# Patient Record
Sex: Female | Born: 1960 | Race: Black or African American | Hispanic: No | Smoking: Never smoker
Health system: Southern US, Community
[De-identification: ages and names within clinical notes are randomized; demographics above are authoritative.]

## PROBLEM LIST (undated history)

## (undated) HISTORY — PX: TUBAL LIGATION: SHX77

---

## 2002-12-22 ENCOUNTER — Encounter: Admission: RE | Admit: 2002-12-22 | Discharge: 2002-12-22 | Payer: Self-pay | Admitting: Sports Medicine

## 2002-12-22 ENCOUNTER — Encounter: Payer: Self-pay | Admitting: Sports Medicine

## 2008-08-09 ENCOUNTER — Other Ambulatory Visit: Admission: RE | Admit: 2008-08-09 | Discharge: 2008-08-09 | Payer: Self-pay | Admitting: Family Medicine

## 2010-11-05 ENCOUNTER — Encounter: Payer: Self-pay | Admitting: Family Medicine

## 2010-11-20 ENCOUNTER — Encounter: Payer: Self-pay | Admitting: Advanced Practice Midwife

## 2010-11-20 ENCOUNTER — Other Ambulatory Visit: Payer: Self-pay | Admitting: Advanced Practice Midwife

## 2010-11-20 ENCOUNTER — Encounter (INDEPENDENT_AMBULATORY_CARE_PROVIDER_SITE_OTHER): Payer: Self-pay | Admitting: Advanced Practice Midwife

## 2010-11-20 ENCOUNTER — Encounter (INDEPENDENT_AMBULATORY_CARE_PROVIDER_SITE_OTHER): Payer: Self-pay | Admitting: *Deleted

## 2010-11-20 DIAGNOSIS — R220 Localized swelling, mass and lump, head: Secondary | ICD-10-CM

## 2010-11-20 DIAGNOSIS — Z202 Contact with and (suspected) exposure to infections with a predominantly sexual mode of transmission: Secondary | ICD-10-CM

## 2010-11-20 DIAGNOSIS — Z01419 Encounter for gynecological examination (general) (routine) without abnormal findings: Secondary | ICD-10-CM

## 2010-11-20 DIAGNOSIS — R221 Localized swelling, mass and lump, neck: Secondary | ICD-10-CM

## 2010-11-25 ENCOUNTER — Inpatient Hospital Stay (HOSPITAL_COMMUNITY)
Admission: AD | Admit: 2010-11-25 | Discharge: 2010-11-25 | Disposition: A | Discharge status: Home / Self Care | Payer: Self-pay | Source: Ambulatory Visit | Attending: Obstetrics & Gynecology | Admitting: Obstetrics & Gynecology

## 2010-11-25 ENCOUNTER — Emergency Department (HOSPITAL_COMMUNITY): Payer: Self-pay

## 2010-11-25 ENCOUNTER — Emergency Department (HOSPITAL_COMMUNITY)
Admission: EM | Admit: 2010-11-25 | Discharge: 2010-11-25 | Disposition: A | Discharge status: Home / Self Care | Payer: Self-pay | Attending: Emergency Medicine | Admitting: Emergency Medicine

## 2010-11-25 DIAGNOSIS — R221 Localized swelling, mass and lump, neck: Secondary | ICD-10-CM | POA: Insufficient documentation

## 2010-11-25 DIAGNOSIS — R220 Localized swelling, mass and lump, head: Secondary | ICD-10-CM | POA: Insufficient documentation

## 2010-11-25 DIAGNOSIS — E049 Nontoxic goiter, unspecified: Secondary | ICD-10-CM

## 2010-11-25 DIAGNOSIS — R5381 Other malaise: Secondary | ICD-10-CM | POA: Insufficient documentation

## 2010-11-25 DIAGNOSIS — R1310 Dysphagia, unspecified: Secondary | ICD-10-CM | POA: Insufficient documentation

## 2010-11-25 DIAGNOSIS — M542 Cervicalgia: Secondary | ICD-10-CM | POA: Insufficient documentation

## 2010-11-25 DIAGNOSIS — R0989 Other specified symptoms and signs involving the circulatory and respiratory systems: Secondary | ICD-10-CM | POA: Insufficient documentation

## 2010-11-25 DIAGNOSIS — R5383 Other fatigue: Secondary | ICD-10-CM | POA: Insufficient documentation

## 2010-11-25 DIAGNOSIS — R0609 Other forms of dyspnea: Secondary | ICD-10-CM | POA: Insufficient documentation

## 2010-11-25 DIAGNOSIS — E042 Nontoxic multinodular goiter: Secondary | ICD-10-CM | POA: Insufficient documentation

## 2010-11-25 LAB — COMPREHENSIVE METABOLIC PANEL
ALT: 20 U/L (ref 0–35)
Albumin: 3.8 g/dL (ref 3.5–5.2)
Alkaline Phosphatase: 67 U/L (ref 39–117)
BUN: 10 mg/dL (ref 6–23)
Calcium: 9.8 mg/dL (ref 8.4–10.5)
Glucose, Bld: 87 mg/dL (ref 70–99)
Potassium: 4.2 mEq/L (ref 3.5–5.1)
Sodium: 142 mEq/L (ref 135–145)
Total Protein: 7.2 g/dL (ref 6.0–8.3)

## 2010-11-25 LAB — TSH
TSH: 2.18 u[IU]/mL (ref 0.350–4.500)
TSH: 1.87 u[IU]/mL (ref 0.350–4.500)

## 2010-11-25 LAB — DIFFERENTIAL
Basophils Absolute: 0 10*3/uL (ref 0.0–0.1)
Basophils Relative: 1 % (ref 0–1)
Eosinophils Absolute: 0.1 10*3/uL (ref 0.0–0.7)
Eosinophils Relative: 3 % (ref 0–5)
Lymphocytes Relative: 55 % — ABNORMAL HIGH (ref 12–46)
Lymphs Abs: 2.9 10*3/uL (ref 0.7–4.0)
Monocytes Absolute: 0.5 10*3/uL (ref 0.1–1.0)
Monocytes Relative: 9 % (ref 3–12)
Neutro Abs: 1.7 10*3/uL (ref 1.7–7.7)
Neutrophils Relative %: 32 % — ABNORMAL LOW (ref 43–77)

## 2010-11-25 LAB — CBC
HCT: 40.1 % (ref 36.0–46.0)
MCH: 29.3 pg (ref 26.0–34.0)
MCHC: 32.2 g/dL (ref 30.0–36.0)
MCV: 90.9 fL (ref 78.0–100.0)
Platelets: 221 10*3/uL (ref 150–400)
RBC: 4.41 MIL/uL (ref 3.87–5.11)
RDW: 13.5 % (ref 11.5–15.5)

## 2010-11-25 LAB — T4, FREE: Free T4: 0.94 ng/dL (ref 0.80–1.80)

## 2010-12-04 ENCOUNTER — Ambulatory Visit: Payer: Self-pay | Admitting: Obstetrics and Gynecology

## 2010-12-26 NOTE — Progress Notes (Unsigned)
Jacqueline Steele, RABINOVICH             ACCOUNT NO.:  [id number]  MEDICAL RECORD NO.:  [id number]           PATIENT TYPE:  A  LOCATION:  [facility]                   FACILITY:  [facility]  PHYSICIAN:  Wynelle Bourgeois, CNM    DATE OF BIRTH:  1960-10-11  DATE OF SERVICE:  11/20/2010                                 CLINIC NOTE  This is a 50 year old African American lady who presents today with a lump on her throat and a feeling of being choked since November.  She states it has gotten worse over the last few weeks.  She has not had any treatment for this so far.  She reports having oral sex in November 2011 and thinks that this might be related to having an sexually transmitted infection from that.  Her medical history is not available at this time.  She is on no medications.  OB HISTORY:  Remarkable for two vaginal deliveries, which were uncomplicated.  She has no history of STDs.  Last Pap was in 2008.  OBJECTIVE DATA:  VITAL SIGNS:  Temperature 97.8, pulse 73, blood pressure 110/74, weight 203.8, height 63 inches. HEENT:  Within normal limits except for a tender soft mass on the anterior portion of the left side of her neck just adjacent to the trachea.  There is no nodularity to this mass.  It is approximately 3-4 cm x 6 cm in size.  It is consistent with the shape of the thyroid gland; however, the swelling is only on the left side.  There is no redness or erythema noted.  Throat is examined and is noted to have moderate erythema in the posterior pharynx.  Strep and GC, Chlamydia cultures were obtained from her throat. NECK:  There are no anterior or posterior cervical nodes palpable.  No superficial clavicular nodes are palpable. CHEST:  Clear. HEART:  Regular rate and rhythm. BREASTS:  Soft and nontender without masses. ABDOMEN:  Soft and nontender without masses. GYNECOLOGIC:  EGD was within normal limits.  Vagina is clean and well rugated.  There is no significant discharge.  Wet  prep is negative. Cervix is closed and long.  Pap is obtained with GC, Chlamydia cultures added to that.  Uterus is small, nontender.  Adnexa are difficult to palpate secondary to habitus. EXTREMITIES:  Within normal limits.  ASSESSMENT: 1. Normal gynecological exam. 2. Soft tissue neck mass, questionable enlarged thyroid versus other     pathologic process.  PLAN:  Pap and cultures sent for GC, Chlamydia and streptococcus. Discussed with the patient possibility that this may not be related to an STD of the throat, but rather maybe for related to some other process including enlarged thyroid, Hashimoto thyroiditis, or possible other neck masses.  We discussed doing an MRI of her neck as well as thyroid blood tests, CBC and ESR.  She is hesitant to do any of these tests because of financial reasons.  She is currently applying for the scholarship program at [employer] [facility] [employer] and wants to defer diagnostic testing until she can get her finances arranged.  She does agree to do a trial of antibiotics, so a prescription was written for Keflex 500 mg  p.o. q.6 h. x7 days.  If she is not significantly better within the next few days, she will seek further care at urgent care or emergency room.  I also instructed her if she develops difficulty breathing to see care at the emergency room.  We will see her back here in 2 weeks for results of her gynecologic and throat culture testing and if she is able to arrange financial aid, then we will pursue getting the initial testing of her MRI and blood tests and refer her to a primary care practice probably [facility].  She is agreeable to this plan.          ______________________________ Wynelle Bourgeois, CNM    MW/MEDQ  D:  11/20/2010  T:  11/21/2010  Job:  [id number]

## 2014-01-27 ENCOUNTER — Ambulatory Visit: Payer: Self-pay | Admitting: Family Medicine

## 2014-06-22 ENCOUNTER — Emergency Department (HOSPITAL_COMMUNITY)
Admission: EM | Admit: 2014-06-22 | Discharge: 2014-06-22 | Disposition: A | Discharge status: Home / Self Care | Payer: Self-pay | Attending: Emergency Medicine | Admitting: Emergency Medicine

## 2014-06-22 ENCOUNTER — Encounter (HOSPITAL_COMMUNITY): Payer: Self-pay | Admitting: Emergency Medicine

## 2014-06-22 DIAGNOSIS — S79929A Unspecified injury of unspecified thigh, initial encounter: Principal | ICD-10-CM

## 2014-06-22 DIAGNOSIS — Y929 Unspecified place or not applicable: Secondary | ICD-10-CM | POA: Insufficient documentation

## 2014-06-22 DIAGNOSIS — M7918 Myalgia, other site: Secondary | ICD-10-CM

## 2014-06-22 DIAGNOSIS — S79919A Unspecified injury of unspecified hip, initial encounter: Secondary | ICD-10-CM | POA: Insufficient documentation

## 2014-06-22 DIAGNOSIS — S4980XA Other specified injuries of shoulder and upper arm, unspecified arm, initial encounter: Secondary | ICD-10-CM | POA: Insufficient documentation

## 2014-06-22 DIAGNOSIS — S46909A Unspecified injury of unspecified muscle, fascia and tendon at shoulder and upper arm level, unspecified arm, initial encounter: Secondary | ICD-10-CM | POA: Insufficient documentation

## 2014-06-22 DIAGNOSIS — Y939 Activity, unspecified: Secondary | ICD-10-CM | POA: Insufficient documentation

## 2014-06-22 DIAGNOSIS — M62838 Other muscle spasm: Secondary | ICD-10-CM | POA: Insufficient documentation

## 2014-06-22 DIAGNOSIS — Z791 Long term (current) use of non-steroidal anti-inflammatories (NSAID): Secondary | ICD-10-CM | POA: Insufficient documentation

## 2014-06-22 DIAGNOSIS — W19XXXA Unspecified fall, initial encounter: Secondary | ICD-10-CM

## 2014-06-22 DIAGNOSIS — M25551 Pain in right hip: Secondary | ICD-10-CM

## 2014-06-22 DIAGNOSIS — R296 Repeated falls: Secondary | ICD-10-CM | POA: Insufficient documentation

## 2014-06-22 MED ORDER — NAPROXEN 500 MG PO TABS: 500.0000 mg | ORAL_TABLET | Freq: Two times a day (BID) | ORAL | Status: DC | PRN

## 2014-06-22 MED ORDER — METHOCARBAMOL 500 MG PO TABS: 500.0000 mg | ORAL_TABLET | Freq: Three times a day (TID) | ORAL | Status: DC | PRN

## 2014-06-22 MED ORDER — HYDROCODONE-ACETAMINOPHEN 5-325 MG PO TABS: 1.0000 | ORAL_TABLET | Freq: Four times a day (QID) | ORAL | Status: DC | PRN

## 2014-06-22 NOTE — ED Provider Notes (Signed)
Medical screening examination/treatment/procedure(s) were performed by non-physician practitioner and as supervising physician I was immediately available for consultation/collaboration.   EKG Interpretation None       Toy Baker, MD 06/22/14 2329

## 2014-06-22 NOTE — ED Notes (Signed)
Pt reports falling today around 1630. Pt states that there was something wet in the floor causing her to fall at a grocery store. Pt states that she fell onto her right side. Pt now reports pain with spasms in the right leg and reports throbbing in the right arm. Pt is ambulatory with assistance. Pt is A/O x4, in NAD, and vitals are WDL.

## 2014-06-22 NOTE — ED Provider Notes (Signed)
CSN: [id number]     Arrival date & time 06/22/14  1942 History  This chart was scribed for non-physician practitioner, Allen Derry, PA-C working with Toy Baker, MD by Gwenyth Ober, ED scribe. This patient was seen in room WTR8/WTR8 and the patient's care was started at 9:02 PM.   Chief Complaint  Patient presents with  . Fall  . Arm Pain  . Leg Pain   Patient is a 53 y.o. female presenting with fall, arm pain, and leg pain. The history is provided by the patient. No language interpreter was used.  Fall This is a new problem. The current episode started 3 to 5 hours ago. The problem occurs rarely. The problem has been resolved. Pertinent negatives include no chest pain, no abdominal pain and no shortness of breath. Nothing aggravates the symptoms. Nothing relieves the symptoms. She has tried nothing for the symptoms.  Arm Pain This is a new problem. The current episode started 3 to 5 hours ago. The problem occurs constantly. The problem has not changed since onset.Pertinent negatives include no chest pain, no abdominal pain and no shortness of breath. Nothing aggravates the symptoms. Nothing relieves the symptoms. She has tried nothing for the symptoms.  Leg Pain Location:  Hip and leg Time since incident:  5 hours Injury: yes   Mechanism of injury: fall   Fall:    Fall occurred:  Unable to specify   Impact surface:  Hard floor   Point of impact: right hip.   Entrapped after fall: no   Hip location:  R hip Leg location:  R leg Pain details:    Quality:  Aching and sharp   Radiates to:  Does not radiate   Severity:  Moderate   Onset quality:  Sudden   Duration:  5 hours   Timing:  Constant   Progression:  Unchanged Chronicity:  New Dislocation: no   Foreign body present:  No foreign bodies Tetanus status:  Unknown Prior injury to area:  No Relieved by:  None tried Worsened by:  Nothing tried Ineffective treatments:  None tried Associated symptoms: no back  pain, no numbness and no tingling    HPI Comments: Jacqueline Steele is a 53 y.o. female with no significant PMHx who presents to the Emergency Department complaining of fall at [facility] 5hrs ago, slipping on wet spot on hard floor, causing R arm and R hip pain. Describes the R arm pain as non-radiating, constant, aching, throbbing located in the upper lateral arm, as well as right hip/buttock pain which is sharp and intermittent, radiating into the lateral thigh, with severity of 8/10. Pain is made worse by pressure and walking as well as attempting to raise her R arm. She states she has not tried any medication or treatment for her symptoms, and leg pain is alleviated slightly by rest. She denies head injury, LOC, right rib pain, SOB, CP, incontinence, numbness or tingling in all extremities, back pain, and abdominal pain. She does not have a prior history of injury to her right side.   History reviewed. No pertinent past medical history. Past Surgical History  Procedure Laterality Date  . Tubal ligation     No family history on file. History  Substance Use Topics  . Smoking status: Never Smoker   . Smokeless tobacco: Never Used  . Alcohol Use: Yes     Comment: Socially    OB History   Grav Para Term Preterm Abortions TAB SAB Ect Mult Living  Review of Systems  Respiratory: Negative for shortness of breath.   Cardiovascular: Negative for chest pain.  Gastrointestinal: Negative for abdominal pain.  Genitourinary:       Negative incontinence  Musculoskeletal: Positive for arthralgias and myalgias. Negative for back pain.  Skin: Negative for wound.  Neurological: Negative for numbness.  10 Systems reviewed and all are negative for acute change except as noted in the HPI.    Allergies  Review of patient's allergies indicates no known allergies.  Home Medications   Prior to Admission medications   Medication Sig Start Date End Date Taking? Authorizing Provider   HYDROcodone-acetaminophen (NORCO) 5-325 MG per tablet Take 1-2 tablets by mouth every 6 (six) hours as needed for severe pain. 06/22/14   Mahi Zabriskie Strupp Camprubi-Soms, PA-C  methocarbamol (ROBAXIN) 500 MG tablet Take 1 tablet (500 mg total) by mouth every 8 (eight) hours as needed for muscle spasms. 06/22/14   Zonia Caplin Strupp Camprubi-Soms, PA-C  naproxen (NAPROSYN) 500 MG tablet Take 1 tablet (500 mg total) by mouth 2 (two) times daily as needed for mild pain, moderate pain or headache (TAKE WITH MEALS.). 06/22/14   Memphis Decoteau Strupp Camprubi-Soms, PA-C   BP 113/74  Pulse 78  Temp(Src) 99 F (37.2 C) (Oral)  Resp 20  SpO2 98% Physical Exam  Nursing note and vitals reviewed. Constitutional: She is oriented to person, place, and time. Vital signs are normal. She appears well-developed and well-nourished. No distress.  VSS, NAD  HENT:  Head: Normocephalic and atraumatic.  Mouth/Throat: Mucous membranes are normal.  Eyes: Conjunctivae and EOM are normal.  Neck: Normal range of motion. Neck supple. No spinous process tenderness and no muscular tenderness present. No rigidity. No tracheal deviation and normal range of motion present.  FROM intact without spinous process or paraspinous muscle TTP, no bony stepoffs or deformities, no muscle spasms. No rigidity or meningeal signs. No bruising or swelling.  Cardiovascular: Normal rate and intact distal pulses.   Pulmonary/Chest: Effort normal. No respiratory distress.  Abdominal: Normal appearance. She exhibits no distension.  Musculoskeletal: Normal range of motion.       Right shoulder: She exhibits tenderness (deltoid). She exhibits normal range of motion, no bony tenderness, no swelling, no effusion, no crepitus, no deformity, no spasm, normal pulse and normal strength.       Right hip: She exhibits tenderness (over gluteus medius). She exhibits normal range of motion, normal strength, no bony tenderness, no swelling, no crepitus and no deformity.   R deltoid mildly TTP with no shoulder joint or bony tenderness, no swelling or effusion, no crepitus, range of motion intact in all fields, no deformity or spasms noted. Negative Apley scratch, negative empty can, negative Hawkins, negative internal and external rotation against resistance. Right gluteus medius with tenderness to palpation and spasm noted. Full range of motion in hip, no crepitus or deformity. No greater trochanteric tenderness to palpation. no swelling or warmth. No bony TTP. Strength 5/5 in all extremities, sensation grossly intact in all extremities. Distal pulses intact. Gait mildly antalgic but able to ambulate. Neg SLR. No spinal TTP  Neurological: She is alert and oriented to person, place, and time. She has normal strength. No sensory deficit. Gait (mildly antalgic, but able to ambulate) abnormal.  Skin: Skin is warm, dry and intact. No bruising noted. No erythema.  Psychiatric: She has a normal mood and affect. Her behavior is normal.    ED Course  Procedures (including critical care time) DIAGNOSTIC STUDIES: Oxygen Saturation is 98% on  RA, normal by my interpretation.    COORDINATION OF CARE: 9:07 PM Will order Robaxin for muscle spasm, naprosyn and norco for pain. Follow up with ED for numbness, fever, incontinence or worsening of symptoms. Advised pt to apply heat for 20 minutes as needed. Pt agreed to plan.   Labs Review Labs Reviewed - No data to display  Imaging Review No results found.   EKG Interpretation None      MDM   Final diagnoses:  Acute right hip pain  Pain of right deltoid  Fall, initial encounter  Muscle spasm of right lower extremity    53y/o female with fall and subsequent musculoskeletal pain and R gluteal region spasm. No red flag s/s of low back pain. No s/s of central cord compression or cauda equina. Lower extremities are neurovascularly intact and patient is ambulating without difficulty.  Patient counseled to use ice or  heat on back for no longer than 15 minutes every hour.   Rx given for muscle relaxer and counseled on proper use of muscle relaxant medication. Rx given for narcotic pain medicine and counseled on proper use of narcotic pain medications. Told that they can increase to every 4 hrs if needed while pain is worse. Counseled not to combine this medication with others containing tylenol. Urged patient not to drink alcohol, drive, or perform any other activities that requires focus while taking either of these medications.   Patient urged to follow-up with PCP if pain does not improve with treatment and rest or if pain becomes recurrent. Urged to return with worsening severe pain, loss of bowel or bladder control, trouble walking. The patient verbalizes understanding and agrees with the plan.  I personally performed the services described in this documentation, which was scribed in my presence. The recorded information has been reviewed and is accurate.  BP 113/74  Pulse 78  Temp(Src) 99 F (37.2 C) (Oral)  Resp 20  SpO2 98%  Meds ordered this encounter  Medications  . naproxen (NAPROSYN) 500 MG tablet    Sig: Take 1 tablet (500 mg total) by mouth 2 (two) times daily as needed for mild pain, moderate pain or headache (TAKE WITH MEALS.).    Dispense:  20 tablet    Refill:  0    Order Specific Question:  Supervising Provider    Answer:  Eber Hong D [[id number]]  . HYDROcodone-acetaminophen (NORCO) 5-325 MG per tablet    Sig: Take 1-2 tablets by mouth every 6 (six) hours as needed for severe pain.    Dispense:  6 tablet    Refill:  0    Order Specific Question:  Supervising Provider    Answer:  Eber Hong D [[id number]]  . methocarbamol (ROBAXIN) 500 MG tablet    Sig: Take 1 tablet (500 mg total) by mouth every 8 (eight) hours as needed for muscle spasms.    Dispense:  15 tablet    Refill:  0    Order Specific Question:  Supervising Provider    Answer:  Vida Roller [address] Camprubi-Soms, PA-C 06/22/14 2147

## 2014-06-22 NOTE — Discharge Instructions (Signed)
Back/Hip Pain:  Your pain should be treated with medicines such as ibuprofen or aleve and this back pain should get better over the next 2 weeks.  However if you develop severe or worsening pain, low back pain with fever, numbness, weakness or inability to walk or urinate, you should return to the ER immediately.  Please follow up with your doctor this week for a recheck if still having symptoms.  Self - care:  The application of heat can help soothe the pain.  Maintaining your daily activities, including walking, is encourged, as it will help you get better faster than just staying in bed. Perform gentle stretching as discussed. Drink plenty of fluids.  Medications are also useful to help with pain control.  A commonly prescribed medications includes Norco. Don't drive while taking this medication.  Non steroidal anti inflammatory medications including Ibuprofen and naproxen;  These medications help both pain and swelling and are very useful in treating back pain.  They should be taken with food, as they can cause stomach upset, and more seriously, stomach bleeding.    Muscle relaxants (robaxin):  These medications can help with muscle tightness that is a cause of lower back pain.  Most of these medications can cause drowsiness, and it is not safe to drive or use dangerous machinery while taking them.  SEEK IMMEDIATE MEDICAL ATTENTION IF: New numbness, tingling, weakness, or problem with the use of your arms or legs.  Severe back pain not relieved with medications.  Difficulty with or loss of control of your bowel or bladder control.  Increasing pain in any areas of the body (such as chest or abdominal pain).  Shortness of breath, dizziness or fainting.  Nausea (feeling sick to your stomach), vomiting, fever, or sweats.  You will need to follow up with  Your primary healthcare provider in 1-2 weeks for reassessment.   Arthralgia Arthralgia is joint pain. A joint is a place where two bones  meet. Joint pain can happen for many reasons. The joint can be bruised, stiff, infected, or weak from aging. Pain usually goes away after resting and taking medicine for soreness.  HOME CARE  Rest the joint as told by your doctor.  Keep the sore joint raised (elevated) for the first 24 hours.  Put ice on the joint area.  Put ice in a plastic bag.  Place a towel between your skin and the bag.  Leave the ice on for 15-20 minutes, 03-04 times a day.  Wear your splint, casting, elastic bandage, or sling as told by your doctor.  Only take medicine as told by your doctor. Do not take aspirin.  Use crutches as told by your doctor. Do not put weight on the joint until told to by your doctor. GET HELP RIGHT AWAY IF:   You have bruising, puffiness (swelling), or more pain.  Your fingers or toes turn blue or start to lose feeling (numb).  Your medicine does not lessen the pain.  Your pain becomes severe.  You have a temperature by mouth above 102 F (38.9 C), not controlled by medicine.  You cannot move or use the joint. MAKE SURE YOU:   Understand these instructions.  Will watch your condition.  Will get help right away if you are not doing well or get worse. Document Released: 09/09/2009 Document Revised: 12/14/2011 Document Reviewed: 09/09/2009 [facility] Patient Information 2015 [place], [place]. This information is not intended to replace advice given to you by your health care provider. Make sure  you discuss any questions you have with your health care provider.  Musculoskeletal Pain Musculoskeletal pain is muscle and boney aches and pains. These pains can occur in any part of the body. Your caregiver may treat you without knowing the cause of the pain. They may treat you if blood or urine tests, X-rays, and other tests were normal.  CAUSES There is often not a definite cause or reason for these pains. These pains may be caused by a type of germ (virus). The discomfort may also  come from overuse. Overuse includes working out too hard when your body is not fit. Boney aches also come from weather changes. Bone is sensitive to atmospheric pressure changes. HOME CARE INSTRUCTIONS   Ask when your test results will be ready. Make sure you get your test results.  Only take over-the-counter or prescription medicines for pain, discomfort, or fever as directed by your caregiver. If you were given medications for your condition, do not drive, operate machinery or power tools, or sign legal documents for 24 hours. Do not drink alcohol. Do not take sleeping pills or other medications that may interfere with treatment.  Continue all activities unless the activities cause more pain. When the pain lessens, slowly resume normal activities. Gradually increase the intensity and duration of the activities or exercise.  During periods of severe pain, bed rest may be helpful. Lay or sit in any position that is comfortable.  Putting ice on the injured area.  Put ice in a bag.  Place a towel between your skin and the bag.  Leave the ice on for 15 to 20 minutes, 3 to 4 times a day.  Follow up with your caregiver for continued problems and no reason can be found for the pain. If the pain becomes worse or does not go away, it may be necessary to repeat tests or do additional testing. Your caregiver may need to look further for a possible cause. SEEK IMMEDIATE MEDICAL CARE IF:  You have pain that is getting worse and is not relieved by medications.  You develop chest pain that is associated with shortness or breath, sweating, feeling sick to your stomach (nauseous), or throw up (vomit).  Your pain becomes localized to the abdomen.  You develop any new symptoms that seem different or that concern you. MAKE SURE YOU:   Understand these instructions.  Will watch your condition.  Will get help right away if you are not doing well or get worse. Document Released: 09/21/2005 Document  Revised: 12/14/2011 Document Reviewed: 05/26/2013 [facility] Patient Information 2015 [place], [place]. This information is not intended to replace advice given to you by your health care provider. Make sure you discuss any questions you have with your health care provider.  Heat Therapy Heat therapy can help make painful, stiff muscles and joints feel better. Do not use heat on new injuries. Wait at least 48 hours after an injury to use heat. Do not use heat when you have aches or pains right after an activity. If you still have pain 3 hours after stopping the activity, then you may use heat. HOME CARE Wet heat pack  Soak a clean towel in warm water. Squeeze out the extra water.  Put the warm, wet towel in a plastic bag.  Place a thin, dry towel between your skin and the bag.  Put the heat pack on the area for 5 minutes, and check your skin. Your skin may be pink, but it should not be red.  Leave the heat pack on the area for 15 to 30 minutes.  Repeat this every 2 to 4 hours while awake. Do not use heat while you are sleeping. Warm water bath  Fill a tub with warm water.  Place the affected body part in the tub.  Soak the area for 20 to 40 minutes.  Repeat as needed. Hot water bottle  Fill the water bottle half full with hot water.  Press out the extra air. Close the cap tightly.  Place a dry towel between your skin and the bottle.  Put the bottle on the area for 5 minutes, and check your skin. Your skin may be pink, but it should not be red.  Leave the bottle on the area for 15 to 30 minutes.  Repeat this every 2 to 4 hours while awake. Electric heating pad  Place a dry towel between your skin and the heating pad.  Set the heating pad on low heat.  Put the heating pad on the area for 10 minutes, and check your skin. Your skin may be pink, but it should not be red.  Leave the heating pad on the area for 20 to 40 minutes.  Repeat this every 2 to 4 hours while  awake.  Do not lie on the heating pad.  Do not fall asleep while using the heating pad.  Do not use the heating pad near water. GET HELP RIGHT AWAY IF:  You get blisters or red skin.  Your skin is puffy (swollen), or you lose feeling (numbness) in the affected area.  You have any new problems.  Your problems are getting worse.  You have any questions or concerns. If you have any problems, stop using heat therapy until you see your doctor. MAKE SURE YOU:  Understand these instructions.  Will watch your condition.  Will get help right away if you are not doing well or get worse. Document Released: 12/14/2011 Document Reviewed: 11/14/2013 [facility] Patient Information 2015 [place], [place]. This information is not intended to replace advice given to you by your health care provider. Make sure you discuss any questions you have with your health care provider.  Muscle Cramps and Spasms Muscle cramps and spasms occur when a muscle or muscles tighten and you have no control over this tightening (involuntary muscle contraction). They are a common problem and can develop in any muscle. The most common place is in the calf muscles of the leg. Both muscle cramps and muscle spasms are involuntary muscle contractions, but they also have differences:   Muscle cramps are sporadic and painful. They may last a few seconds to a quarter of an hour. Muscle cramps are often more forceful and last longer than muscle spasms.  Muscle spasms may or may not be painful. They may also last just a few seconds or much longer. CAUSES  It is uncommon for cramps or spasms to be due to a serious underlying problem. In many cases, the cause of cramps or spasms is unknown. Some common causes are:   Overexertion.   Overuse from repetitive motions (doing the same thing over and over).   Remaining in a certain position for a long period of time.   Improper preparation, form, or technique while performing a  sport or activity.   Dehydration.   Injury.   Side effects of some medicines.   Abnormally low levels of the salts and ions in your blood (electrolytes), especially potassium and calcium. This could happen if you are  taking water pills (diuretics) or you are pregnant.  Some underlying medical problems can make it more likely to develop cramps or spasms. These include, but are not limited to:   Diabetes.   Parkinson disease.   Hormone disorders, such as thyroid problems.   Alcohol abuse.   Diseases specific to muscles, joints, and bones.   Blood vessel disease where not enough blood is getting to the muscles.  HOME CARE INSTRUCTIONS   Stay well hydrated. Drink enough water and fluids to keep your urine clear or pale yellow.  It may be helpful to massage, stretch, and relax the affected muscle.  For tight or tense muscles, use a warm towel, heating pad, or hot shower water directed to the affected area.  If you are sore or have pain after a cramp or spasm, applying ice to the affected area may relieve discomfort.  Put ice in a plastic bag.  Place a towel between your skin and the bag.  Leave the ice on for 15-20 minutes, 03-04 times a day.  Medicines used to treat a known cause of cramps or spasms may help reduce their frequency or severity. Only take over-the-counter or prescription medicines as directed by your caregiver. SEEK MEDICAL CARE IF:  Your cramps or spasms get more severe, more frequent, or do not improve over time.  MAKE SURE YOU:   Understand these instructions.  Will watch your condition.  Will get help right away if you are not doing well or get worse. Document Released: 03/13/2002 Document Revised: 01/16/2013 Document Reviewed: 09/07/2012 [facility] Patient Information 2015 [place], [place]. This information is not intended to replace advice given to you by your health care provider. Make sure you discuss any questions you have with your health  care provider.

## 2021-05-07 ENCOUNTER — Other Ambulatory Visit: Payer: Self-pay | Admitting: Urology

## 2021-05-07 DIAGNOSIS — M25559 Pain in unspecified hip: Secondary | ICD-10-CM

## 2021-05-23 ENCOUNTER — Ambulatory Visit
Admission: RE | Admit: 2021-05-23 | Discharge: 2021-05-23 | Disposition: A | Discharge status: Home / Self Care | Payer: Disability Insurance | Source: Ambulatory Visit | Attending: Urology | Admitting: Urology

## 2021-05-23 ENCOUNTER — Other Ambulatory Visit: Payer: Self-pay | Admitting: Urology

## 2021-05-23 DIAGNOSIS — M25559 Pain in unspecified hip: Secondary | ICD-10-CM | POA: Diagnosis not present

## 2022-05-27 ENCOUNTER — Observation Stay: Payer: Self-pay

## 2022-05-27 ENCOUNTER — Inpatient Hospital Stay
Admission: EM | Admit: 2022-05-27 | Discharge: 2022-05-29 | DRG: 065 | Disposition: A | Discharge status: Home / Self Care | Payer: Self-pay | Attending: Internal Medicine | Admitting: Internal Medicine

## 2022-05-27 ENCOUNTER — Observation Stay: Admit: 2022-05-27 | Payer: Self-pay

## 2022-05-27 ENCOUNTER — Emergency Department: Payer: Self-pay

## 2022-05-27 ENCOUNTER — Other Ambulatory Visit: Payer: Self-pay

## 2022-05-27 DIAGNOSIS — Z79899 Other long term (current) drug therapy: Secondary | ICD-10-CM

## 2022-05-27 DIAGNOSIS — E049 Nontoxic goiter, unspecified: Secondary | ICD-10-CM | POA: Diagnosis present

## 2022-05-27 DIAGNOSIS — Z713 Dietary counseling and surveillance: Secondary | ICD-10-CM

## 2022-05-27 DIAGNOSIS — I639 Cerebral infarction, unspecified: Principal | ICD-10-CM | POA: Diagnosis present

## 2022-05-27 DIAGNOSIS — I6389 Other cerebral infarction: Principal | ICD-10-CM | POA: Diagnosis present

## 2022-05-27 DIAGNOSIS — E669 Obesity, unspecified: Secondary | ICD-10-CM

## 2022-05-27 DIAGNOSIS — G8191 Hemiplegia, unspecified affecting right dominant side: Secondary | ICD-10-CM | POA: Diagnosis present

## 2022-05-27 DIAGNOSIS — Z6838 Body mass index (BMI) 38.0-38.9, adult: Secondary | ICD-10-CM

## 2022-05-27 LAB — CBC
HCT: 43.4 % (ref 36.0–46.0)
HCT: 41.7 % (ref 36.0–46.0)
Hemoglobin: 13.7 g/dL (ref 12.0–15.0)
Hemoglobin: 13.2 g/dL (ref 12.0–15.0)
MCH: 29.3 pg (ref 26.0–34.0)
MCH: 29.3 pg (ref 26.0–34.0)
MCHC: 31.6 g/dL (ref 30.0–36.0)
MCHC: 31.7 g/dL (ref 30.0–36.0)
MCV: 92.9 fL (ref 80.0–100.0)
MCV: 92.5 fL (ref 80.0–100.0)
Platelets: 204 10*3/uL (ref 150–400)
Platelets: 206 10*3/uL (ref 150–400)
RBC: 4.67 MIL/uL (ref 3.87–5.11)
RBC: 4.51 MIL/uL (ref 3.87–5.11)
RDW: 14.1 % (ref 11.5–15.5)
RDW: 14.3 % (ref 11.5–15.5)
WBC: 4.7 10*3/uL (ref 4.0–10.5)
WBC: 5.8 10*3/uL (ref 4.0–10.5)
nRBC: 0 % (ref 0.0–0.2)
nRBC: 0 % (ref 0.0–0.2)

## 2022-05-27 LAB — COMPREHENSIVE METABOLIC PANEL
ALT: 21 U/L (ref 0–44)
AST: 27 U/L (ref 15–41)
Albumin: 3.4 g/dL — ABNORMAL LOW (ref 3.5–5.0)
Alkaline Phosphatase: 59 U/L (ref 38–126)
Anion gap: 8 (ref 5–15)
BUN: 8 mg/dL (ref 8–23)
CO2: 26 mmol/L (ref 22–32)
Calcium: 8.5 mg/dL — ABNORMAL LOW (ref 8.9–10.3)
Chloride: 111 mmol/L (ref 98–111)
Creatinine, Ser: 0.81 mg/dL (ref 0.44–1.00)
GFR, Estimated: >60 mL/min (ref >60)
Glucose, Bld: 104 mg/dL — ABNORMAL HIGH (ref 70–99)
Potassium: 3.6 mmol/L (ref 3.5–5.1)
Sodium: 145 mmol/L (ref 135–145)
Total Bilirubin: 0.8 mg/dL (ref 0.3–1.2)
Total Protein: 5.9 g/dL — ABNORMAL LOW (ref 6.5–8.1)

## 2022-05-27 LAB — CREATININE, SERUM
Creatinine, Ser: 0.78 mg/dL (ref 0.44–1.00)
GFR, Estimated: >60 mL/min (ref >60)

## 2022-05-27 MED ORDER — ACETAMINOPHEN 160 MG/5ML PO SOLN: 650.0000 mg | ORAL | Status: DC | PRN

## 2022-05-27 MED ORDER — ACETAMINOPHEN 650 MG RE SUPP: 650.0000 mg | RECTAL | Status: DC | PRN

## 2022-05-27 MED ORDER — ASPIRIN 325 MG PO TABS
325.0000 mg | ORAL_TABLET | Freq: Every day | ORAL | Status: DC
  Administered 2022-05-27: 325 mg via ORAL
  Filled 2022-05-27 (×2): qty 1

## 2022-05-27 MED ORDER — ASPIRIN 300 MG RE SUPP
300.0000 mg | Freq: Every day | RECTAL | Status: DC
  Filled 2022-05-27: qty 1

## 2022-05-27 MED ORDER — SENNOSIDES-DOCUSATE SODIUM 8.6-50 MG PO TABS
1.0000 | ORAL_TABLET | Freq: Two times a day (BID) | ORAL | Status: DC
  Administered 2022-05-29: 1 via ORAL
  Filled 2022-05-27 (×4): qty 1

## 2022-05-27 MED ORDER — ACETAMINOPHEN 325 MG PO TABS
650.0000 mg | ORAL_TABLET | ORAL | Status: DC | PRN
  Administered 2022-05-29: 650 mg via ORAL
  Filled 2022-05-27 (×2): qty 2

## 2022-05-27 MED ORDER — ENOXAPARIN SODIUM 60 MG/0.6ML IJ SOSY
0.5000 mg/kg | PREFILLED_SYRINGE | INTRAMUSCULAR | Status: DC
  Administered 2022-05-27 – 2022-05-28 (×2): 50 mg via SUBCUTANEOUS
  Filled 2022-05-27 (×2): qty 0.6

## 2022-05-27 MED ORDER — STROKE: EARLY STAGES OF RECOVERY BOOK: Freq: Once | Status: AC

## 2022-05-27 MED ORDER — IOHEXOL 350 MG/ML SOLN
75.0000 mL | Freq: Once | INTRAVENOUS | Status: AC | PRN
  Administered 2022-05-27: 75 mL via INTRAVENOUS

## 2022-05-27 NOTE — ED Notes (Signed)
Pt states she was just started on a new medication but is unable to recall which one. Pt also unable to recall her pharmacy.

## 2022-05-27 NOTE — H&P (Signed)
History and Physical    Patient: Jacqueline Steele VQM:[id number] DOB: March 18, 1961 DOA: 05/27/2022 DOS: the patient was seen and examined on 05/27/2022 PCP: [facility]  Patient coming from: Home, she is independent of all ADLs.  Chief Complaint:  Chief Complaint  Patient presents with   Altered Mental Status   HPI: Jacqueline Steele is a 61 y.o. female with no significant medical history who presents to the hospital with altered mental status, dizziness, and a right-sided weakness started 7 PM yesterday.  Patient was mowing her lawn yesterday evening, when she she felt dizzy and lightheaded suddenly, she also feels so weak on the right side, she could not walk.  She was also confused intermittently.  Her dizziness appears to be better with time, but she still feels weak even today in her right leg.  Upon arriving in the hospital, her blood pressure was 127/77, heart rate 78, temperature 98.1.  She had a normal renal function, glucose 104, no leukocytosis.  CT head without contrast showed a left thalamic possible ischemic stroke.  Neurology consult was obtained.   Review of Systems: As mentioned in the history of present illness. All other systems reviewed and are negative. No past medical history on file. Past Surgical History:  Procedure Laterality Date   TUBAL LIGATION     Social History:  reports that she has never smoked. She has never used smokeless tobacco. She reports current alcohol use. She reports that she does not use drugs.  No Known Allergies  Family history. Both parents died of natural course at the age of 9s, no family history of heart disease or stroke.  Prior to Admission medications   Medication Sig Start Date End Date Taking? Authorizing Provider  HYDROcodone-acetaminophen (NORCO) 5-325 MG per tablet Take 1-2 tablets by mouth every 6 (six) hours as needed for severe pain. 06/22/14   Street, [place], PA-C  methocarbamol (ROBAXIN) 500 MG tablet Take 1  tablet (500 mg total) by mouth every 8 (eight) hours as needed for muscle spasms. 06/22/14   Street, [place], PA-C  naproxen (NAPROSYN) 500 MG tablet Take 1 tablet (500 mg total) by mouth 2 (two) times daily as needed for mild pain, moderate pain or headache (TAKE WITH MEALS.). 06/22/14   Street, [place], [place]    Physical Exam: Vitals:   05/27/22 1143 05/27/22 1144 05/27/22 1500  BP: 127/77  130/69  Pulse: 78  81  Resp: 18  18  Temp: 98.1 F (36.7 C)  98.1 F (36.7 C)  TempSrc: Oral  Oral  SpO2: 92%  98%  Weight:  99.8 kg   Height:  5\' 3"  (1.6 m)    Physical Exam Constitutional:      General: She is not in acute distress.    Appearance: She is obese. She is not ill-appearing.  HENT:     Head: Normocephalic and atraumatic.     Nose: Nose normal.     Mouth/Throat:     Mouth: Mucous membranes are moist.     Pharynx: Oropharynx is clear.  Eyes:     Conjunctiva/sclera: Conjunctivae normal.     Pupils: Pupils are equal, round, and reactive to light.  Cardiovascular:     Rate and Rhythm: Normal rate and regular rhythm.     Heart sounds: No murmur heard. Pulmonary:     Effort: Pulmonary effort is normal. No respiratory distress.     Breath sounds: Normal breath sounds.  Abdominal:     General: Abdomen is flat. There is no  distension.     Palpations: Abdomen is soft.     Tenderness: There is no abdominal tenderness.  Musculoskeletal:        General: No swelling or tenderness. Normal range of motion.     Cervical back: Normal range of motion and neck supple. No rigidity or tenderness.  Lymphadenopathy:     Cervical: No cervical adenopathy.  Skin:    General: Skin is warm and dry.     Coloration: Skin is not jaundiced.  Neurological:     Mental Status: She is alert and oriented to person, place, and time.     Motor: Weakness present.     Comments: Leg weakness.  Psychiatric:        Mood and Affect: Mood normal.        Behavior: Behavior normal.     Data Reviewed:  CT  head without contrast showed left thalamic's stroke. Renal function normal, CBC normal.  Assessment and Plan: Acute left thalamic stroke. Only risk factor for stroke for the patient is obesity, no history of type 2 diabetes or high blood pressure.  We will check lipid panel. We will start aspirin.  We will start statin if LDL was elevated. Consult neurology will be obtained. Stroke work-up will be performed including CT angiogram neck and head, echocardiogram.  Also on telemetry to monitor for atrial fibrillation.  Obesity with BMI 38.97. Diet exercise advised.     Advance Care Planning:   Code Status: Full Code Full  Consults: Neurology  Family Communication: Son at bedside  Severity of Illness: The appropriate patient status for this patient is OBSERVATION. Observation status is judged to be reasonable and necessary in order to provide the required intensity of service to ensure the patient's safety. The patient's presenting symptoms, physical exam findings, and initial radiographic and laboratory data in the context of their medical condition is felt to place them at decreased risk for further clinical deterioration. Furthermore, it is anticipated that the patient will be medically stable for discharge from the hospital within 2 midnights of admission.   Author: Marrion Coy, MD 05/27/2022 3:26 PM  For on call review [email or web address].

## 2022-05-27 NOTE — ED Provider Notes (Addendum)
[facility] Provider Note    Event Date/Time   First MD Initiated Contact with Patient 05/27/22 1326     (approximate)   History   Altered Mental Status   HPI  Jacqueline Steele is a 61 y.o. female   Past medical history of no significant past medical history who presents with altered mental status, generalized weakness, dizziness.  The symptoms had acute onset at around 7 PM last night when she was mowing the lawn.  She denies falls or trauma.  She went to sleep and was still feeling not quite right today though no acute changes today.  Her children visited her and she just seems not normal so they brought her to the emergency department.  Denies trauma.  No pain.  Cannot quite localize how she does not feel well.  Denies chest pain shortness of breath, focal weakness.  Does state that the right side of her face feels numb.  History was obtained via the patient, and her son who is at bedside.      Physical Exam   Triage Vital Signs: ED Triage Vitals  Enc Vitals Group     BP 05/27/22 1143 127/77     Pulse Rate 05/27/22 1143 78     Resp 05/27/22 1143 18     Temp 05/27/22 1143 98.1 F (36.7 C)     Temp Source 05/27/22 1143 Oral     SpO2 05/27/22 1143 92 %     Weight 05/27/22 1144 220 lb (99.8 kg)     Height 05/27/22 1144 5\' 3"  (1.6 m)     Head Circumference --      Peak Flow --      Pain Score 05/27/22 1144 0     Pain Loc --      Pain Edu? --      Excl. in GC? --     Most recent vital signs: Vitals:   05/27/22 1143  BP: 127/77  Pulse: 78  Resp: 18  Temp: 98.1 F (36.7 C)  SpO2: 92%    General: Awake, no distress.  Slow speech, slowed from her normal speech per her son. CV:  Good peripheral perfusion.  Resp:  Normal effort.  Abd:  No distention.  Non-tender Other:  She has some sensory loss to the right side of her face and questionable facial asymmetry with a ? slight droop on the left side -son is at bedside and is unsure whether  this is a change from baseline or not.  No focal weakness but instead global weakness both upper and lower extremities.  No sensory loss to the extremities.  No vision field deficits.  Extraocular movements intact pupils: Round and reactive.  Finger-to-nose is normal.  Given her global weakness, I did not test gait.   ED Results / Procedures / Treatments   Labs (all labs ordered are listed, but only abnormal results are displayed) Labs Reviewed  COMPREHENSIVE METABOLIC PANEL - Abnormal; Notable for the following components:      Result Value   Glucose, Bld 104 (*)    Calcium 8.5 (*)    Total Protein 5.9 (*)    Albumin 3.4 (*)    All other components within normal limits  CBC  CBG MONITORING, ED     I reviewed labs and they are notable for glucose 104, electrolytes within normal limits.  EKG  ED ECG REPORT I, 05/29/22, the attending physician, personally viewed and interpreted this ECG.   Date:  05/27/2022  EKG Time: 1152  Rate: 75  Rhythm: normal EKG, normal sinus rhythm  Axis: normal  Intervals: normal no ischemia    RADIOLOGY I dependently reviewed and interpreted CAT scan of the head and see no obvious bleeding or midline shift.  However, official radiology reads notes a finding consistent with thalamic infarct on the left.   PROCEDURES:  Critical Care performed: No  Procedures   MEDICATIONS ORDERED IN ED: Medications - No data to display  Consultants:  I spoke with neurologist regarding care plan for this patient.   IMPRESSION / MDM / ASSESSMENT AND PLAN / ED COURSE  I reviewed the triage vital signs and the nursing notes.                              Differential diagnosis includes, but is not limited to, stroke, head bleed, infection, AKI, metabolic derangements.    MDM: Altered mental status, vague nondescript symptoms but with neurological findings on my exam very mild but concerning for stroke.  Outside of the thrombolytic window.  CT scan of  the head reveals left-sided thalamic stroke, will order MRI brain to better characterize.  Page sent to neurologist to discuss further evaluation and management.  Talk with on-call neurologist this time to discuss further management and treatment.  Outside of thrombolytic window.  With mild symptoms as described above, not likely thrombectomy candidate.  Ordered for q2h neuro checks and informed patient and her son to notify care team right away for any changes or worsening.   Proceed with MRI brain and other stroke work-up as an inpatient.  Admit to hospitalist.   Patient's presentation is most consistent with acute presentation with potential threat to life or bodily function.       FINAL CLINICAL IMPRESSION(S) / ED DIAGNOSES   Final diagnoses:  Cerebrovascular accident (CVA), unspecified mechanism (HCC)     Rx / DC Orders   ED Discharge Orders     None        Note:  This document was prepared using Dragon voice recognition software and may include unintentional dictation errors.    Pilar Jarvis, MD 05/27/22 1453    Pilar Jarvis, MD 05/27/22 [phone]

## 2022-05-27 NOTE — ED Notes (Signed)
Informed RN bed assigned 

## 2022-05-27 NOTE — Consult Note (Signed)
NEURO HOSPITALIST CONSULT NOTE   Requesting physician: Dr. Chipper Herb  Reason for Consult: Subacute left thalamic infarct seen on CT  History obtained from:  Patient, Son and Chart     HPI:                                                                                                                                          Jacqueline Steele is a 61 y.o. female with stated negative PMHx who presented to the ED this morning after she was noted by son and granddaughter to be confused, with unusually soft-spoken speech and behavior suggesting apathy/fatigue. When son asked mother what was wrong, she stated that she just did not feel right. LKN was when she was mowing the lawn yesterday after which she became dizzy. Family called EMS and on arrival to the ED the patient was oriented except for the date. She was noted to be forgetful, unable to recall a new medication nor the pharmacy where she had it filled.   Additional history from EDP HPI was reviewed: "The symptoms had acute onset at around 7 PM last night when she was mowing the lawn.  She denies falls or trauma.  She went to sleep and was still feeling not quite right today though no acute changes today.  Her children visited her and she just seems not normal so they brought her to the emergency department. Denies trauma.  No pain.  Cannot quite localize how she does not feel well.  Denies chest pain shortness of breath, focal weakness. Does state that the right side of her face feels numb."  No past medical history on file.  Past Surgical History:  Procedure Laterality Date   TUBAL LIGATION      No family history on file.           Social History:  reports that she has never smoked. She has never used smokeless tobacco. She reports current alcohol use. She reports that she does not use drugs.  No Known Allergies  HOME MEDICATIONS:                                                                                                                       No current facility-administered medications on file prior to  encounter.   Current Outpatient Medications on File Prior to Encounter  Medication Sig Dispense Refill   cyclobenzaprine (FLEXERIL) 5 MG tablet Take 5 mg by mouth at bedtime as needed.     GNP 8 HOUR PAIN RELIEVER 650 MG CR tablet Take 1 tablet by mouth 3 (three) times daily as needed.     HYDROcodone-acetaminophen (NORCO) 5-325 MG per tablet Take 1-2 tablets by mouth every 6 (six) hours as needed for severe pain. 6 tablet 0   meloxicam (MOBIC) 15 MG tablet Take 15 mg by mouth daily.     methocarbamol (ROBAXIN) 500 MG tablet Take 1 tablet (500 mg total) by mouth every 8 (eight) hours as needed for muscle spasms. 15 tablet 0   naproxen (NAPROSYN) 500 MG tablet Take 1 tablet (500 mg total) by mouth 2 (two) times daily as needed for mild pain, moderate pain or headache (TAKE WITH MEALS.). 20 tablet 0     ROS:                                                                                                                                       Denies vision changes. Does not feel that her face is weak. No dysphagia. No loss of appetite. No CP or SOB. Feels that her language comprehension and speech are normal but that she just does not "feel right". Denies presyncope or vertigo, characterizing her dizziness as an unsteady sensation. Does not endorse any headache. Other ROS as per HPI.    Blood pressure 130/69, pulse 81, temperature 98.1 F (36.7 C), temperature source Oral, resp. rate 18, height 5\' 3"  (1.6 m), weight 99.8 kg, SpO2 98 %.   General Examination:                                                                                                       Physical Exam  HEENT-  [place]/AT Lungs- Respirations unlabored Extremities- No edema   Neurological Examination Mental Status: Awake with mildly decreased level of alertness. Appears confused. Speech is sparse but fluent, without dysarthria. Comprehension  for basic questions and commands is intact. Correctly names 3 fingers. Repetition intact on second trial, but made one error on initial trial. Initially states "19..." when asked for the year, then correctly gives 2023. Oriented to the month but not the day of the week. Correctly identifies the city and the state. Answers with "2024..." and then trails off, appearing confused when asked who the president is. Son states that  her replies are not normal for her and that her voice is unusually soft and hesitant.  Cranial Nerves: II: Visual fields are intact. No extinction to DSS.  III,IV, VI: No ptosis. EOMI. No nystagmus. Seems to have a slight left gaze preference.  V: Temp sensation equal bilaterally.  VII: Subtle decreased movement of right perioral muscles when speaking, but smile/grimace are symmetric.  VIII: Hearing is intact to voice IX,X: Mildly hypophonic speech XI: Slight leftward preferential rotation of the head during interview despite examiner standing on left and right sides of patient at different time points.  XII: Midline tongue extension Motor: RUE 4+/5 proximally and distally with slight bobbing drift during testing of [place].  LUE 5/5 RLE 4+/5 with mildly increased latencies of motor responses LLE 5/5 Sensory: Temp and FT sensation intact to BUE.  Decreased temp sensation to RLE.  No extinction with DSS.  Deep Tendon Reflexes: 2+ and symmetric throughout Plantars: Right: downgoing   Left: downgoing Cerebellar: Mild pastpointing with right FNF. Normal FNF on the left. Mild dyssinergia with right H-S. Normal H-S on the left.  Gait: Deferred   Lab Results: Basic Metabolic Panel: Recent Labs  Lab 05/27/22 1147  NA 145  K 3.6  CL 111  CO2 26  GLUCOSE 104*  BUN 8  CREATININE 0.81  CALCIUM 8.5*    CBC: Recent Labs  Lab 05/27/22 1147  WBC 4.7  HGB 13.7  HCT 43.4  MCV 92.9  PLT 204    Cardiac Enzymes: No results for input(s): "CKTOTAL", "CKMB", "CKMBINDEX",  "TROPONINI" in the last 168 hours.  Lipid Panel: No results for input(s): "CHOL", "TRIG", "HDL", "CHOLHDL", "VLDL", "LDLCALC" in the last 168 hours.  Imaging: CT ANGIO HEAD NECK W WO CM  Result Date: 05/27/2022 CLINICAL DATA:  Neuro deficit, stroke suspected EXAM: CT ANGIOGRAPHY HEAD AND NECK TECHNIQUE: Multidetector CT imaging of the head and neck was performed using the standard protocol during bolus administration of intravenous contrast. Multiplanar CT image reconstructions and MIPs were obtained to evaluate the vascular anatomy. Carotid stenosis measurements (when applicable) are obtained utilizing NASCET criteria, using the distal internal carotid diameter as the denominator. RADIATION DOSE REDUCTION: This exam was performed according to the departmental dose-optimization program which includes automated exposure control, adjustment of the mA and/or kV according to patient size and/or use of iterative reconstruction technique. CONTRAST:  30mL OMNIPAQUE IOHEXOL 350 MG/ML SOLN COMPARISON:  Same-day noncontrast CT head, CT neck 11/25/2010 FINDINGS: CTA NECK FINDINGS Aortic arch: The aortic arch is normal. The origins of the major branch vessels are patent. The subclavian arteries are patent to the level imaged. Right carotid system: The right common, internal, and external carotid arteries are patent, without hemodynamically significant stenosis or occlusion. There is no dissection or aneurysm. Left carotid system: The left common, internal, and external carotid arteries are patent, without hemodynamically significant stenosis or occlusion. There is no dissection or aneurysm. Vertebral arteries: The vertebral arteries are patent, without hemodynamically significant stenosis or occlusion. There is no dissection or aneurysm. Skeleton: There is multilevel degenerative change of the cervical spine with bulky anterior osteophytes. There is no acute osseous abnormality or suspicious osseous lesion. Is no  visible canal hematoma. Other neck: Left thyroid lobe is surgically absent. The right thyroid lobe is enlarged and heterogeneous and extends into the upper mediastinum. This has been previously evaluated by ultrasound in 2012. Upper chest: The imaged lungs are clear. Review of the MIP images confirms the above findings CTA HEAD FINDINGS Anterior circulation:  The intracranial ICAs are patent. The bilateral MCAs are patent. The bilateral ACAs are patent. The anterior communicating artery is normal. There is no aneurysm or AVM. Posterior circulation: The bilateral V4 segments are patent common markedly diminutive on the right after the PICA origin, likely a developmental variant. The basilar artery is patent but diminutive. The major cerebellar artery origins are normal. The bilateral PCAs are patent, supplied by posterior communicating arteries (fetal origins). There is no aneurysm or AVM. Venous sinuses: Patent. Anatomic variants: As above. Review of the MIP images confirms the above findings IMPRESSION: 1. Patent vasculature of the head and neck with no hemodynamically significant stenosis, occlusion, or dissection. 2. Enlarged and heterogeneous right thyroid lobe extending into the upper mediastinum, previously evaluated by ultrasound 2012. Consider nonemergent repeat ultrasound. Electronically Signed   By: Lesia Hausen M.D.   On: 05/27/2022 16:04   CT HEAD WO CONTRAST ( )  Result Date: 05/27/2022 CLINICAL DATA:  Altered mental status. EXAM: CT HEAD WITHOUT CONTRAST TECHNIQUE: Contiguous axial images were obtained from the base of the skull through the vertex without intravenous contrast. RADIATION DOSE REDUCTION: This exam was performed according to the departmental dose-optimization program which includes automated exposure control, adjustment of the mA and/or kV according to patient size and/or use of iterative reconstruction technique. COMPARISON:  None Available. FINDINGS: Brain: A 1.5 cm hypodensity in  the left thalamus is suspicious for an acute infarct. No acute cortically based infarct, intracranial hemorrhage, midline shift, or extra-axial fluid collection is identified. The ventricles and sulci are normal. Vascular: No hyperdense vessel. Skull: No fracture or suspicious osseous lesion. Sinuses/Orbits: Visualized paranasal sinuses and mastoid air cells are clear. Unremarkable orbits. Other: None. IMPRESSION: Suspected acute left thalamic infarct. Electronically Signed   By: Sebastian Ache M.D.   On: 05/27/2022 12:39     Assessment: 61 year old female presenting to the ED with acute onset of confusion, sensation of dizziness, mild facial asymmetry and mild RUE weakness - Imaging: - CT head reveals a small hypodensity in the left thalamus that appears most consistent with an early subacute ischemic infarction.  - CTA of head and neck: Patent vasculature of the head and neck with no hemodynamically significant stenosis, occlusion, or dissection. - Exam reveals confusion and hesitant speech pattern in conjunction with relatively subtle findings referable to the left thalamic hypodensity seen on CT. Of note, per the literature thalamic stroke may result in cognitive and linguistic problems, but the underlying mechanism remains unknown.  - Possible underlying etiologies for her stroke would include cardioembolic, atherothrombotic and secondary to possible chronic hypertensive microangiopathy if she has undiagnosed chronic HTN  Recommendations: - HgbA1c, fasting lipid panel - MRI brain  - PT consult, OT consult, Speech consult - TTE - Start atorvastatin 40 mg po qd. Obtain baseline CK level - Start ASA 81 mg po qd  - Telemetry monitoring - Frequent neuro checks - NPO until passes stroke swallow screen - BP management with permissive HTN for the first 24 hours since symptom onset, then BP control per standard protocol   Electronically signed: Dr. Caryl Pina 05/27/2022, 4:16 PM

## 2022-05-27 NOTE — Progress Notes (Signed)
PHARMACIST - PHYSICIAN COMMUNICATION  CONCERNING:  Enoxaparin (Lovenox) for DVT Prophylaxis    RECOMMENDATION: Patient was prescribed enoxaprin 40mg  q24 hours for VTE prophylaxis.   Filed Weights   05/27/22 1144  Weight: 99.8 kg (220 lb)    Body mass index is 38.97 kg/m.  Estimated Creatinine Clearance: 82.2 mL/min (by C-G formula based on SCr of 0.81 mg/dL).   Based on [facility] policy patient is candidate for enoxaparin 0.5mg /kg TBW SQ every 24 hours based on BMI being >30.   DESCRIPTION: Pharmacy has adjusted enoxaparin dose per [facility] policy.  Patient is now receiving enoxaparin 50 mg (0.5 mg/kg) every 24 hours   [facility], PharmD Clinical Pharmacist  05/27/2022 3:29 PM

## 2022-05-27 NOTE — ED Triage Notes (Addendum)
Pt here with AMS. Pt family states pt is not normally confused, pt agrees that she does not feel like herself. Pt states she is not weak but she just does not feel good. Pt states the LKW was when she was mowing the lawn yesterday afternoon then shortly became dizzy. Pt able to answer orientation questions except the date. Pt denies pain.

## 2022-05-28 ENCOUNTER — Inpatient Hospital Stay (HOSPITAL_COMMUNITY)
Admit: 2022-05-28 | Discharge: 2022-05-28 | Disposition: A | Discharge status: Home / Self Care | Payer: Self-pay | Attending: Internal Medicine | Admitting: Internal Medicine

## 2022-05-28 DIAGNOSIS — I6389 Other cerebral infarction: Secondary | ICD-10-CM

## 2022-05-28 LAB — LIPID PANEL
Cholesterol: 185 mg/dL (ref 0–200)
HDL: 55 mg/dL (ref >40)
LDL Cholesterol: 103 mg/dL — ABNORMAL HIGH (ref 0–99)
Total CHOL/HDL Ratio: 3.4 RATIO
Triglycerides: 137 mg/dL (ref <150)
VLDL: 27 mg/dL (ref 0–40)

## 2022-05-28 LAB — HEMOGLOBIN A1C
Hgb A1c MFr Bld: 5.7 % — ABNORMAL HIGH (ref 4.8–5.6)
Mean Plasma Glucose: 116.89 mg/dL

## 2022-05-28 LAB — ECHOCARDIOGRAM COMPLETE
Height: 63 in
S' Lateral: 2.5 cm
Weight: 3520 oz

## 2022-05-28 LAB — HIV ANTIBODY (ROUTINE TESTING W REFLEX): HIV Screen 4th Generation wRfx: NONREACTIVE

## 2022-05-28 MED ORDER — CLOPIDOGREL BISULFATE 75 MG PO TABS
75.0000 mg | ORAL_TABLET | Freq: Every day | ORAL | Status: DC
Start: 2022-05-28 — End: 2022-05-29
  Administered 2022-05-28 – 2022-05-29 (×2): 75 mg via ORAL
  Filled 2022-05-28 (×2): qty 1

## 2022-05-28 MED ORDER — ATORVASTATIN CALCIUM 20 MG PO TABS
80.0000 mg | ORAL_TABLET | Freq: Every day | ORAL | Status: DC
  Administered 2022-05-28 – 2022-05-29 (×2): 80 mg via ORAL
  Filled 2022-05-28 (×2): qty 4

## 2022-05-28 MED ORDER — ASPIRIN 81 MG PO CHEW
81.0000 mg | CHEWABLE_TABLET | Freq: Every day | ORAL | Status: DC
  Administered 2022-05-28 – 2022-05-29 (×2): 81 mg via ORAL
  Filled 2022-05-28 (×2): qty 1

## 2022-05-28 NOTE — Plan of Care (Addendum)
MRI brain: 15 mm acute infarct left thalamus. Punctate acute infarct right frontal white matter. Mild chronic microvascular ischemic change in the white matter.  TTE: Completed with report pending.   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 Therefore: - If TTE is negative, will need TEE - If telemetry does not reveal a-fib, most likely will need a Zio patch  Neurology will continue to follow as work up proceeds.   Addendum: - TTE report conclusions: 1. Left ventricular ejection fraction, by estimation, is 60 to 65%. The  left ventricle has normal function. The left ventricle has no regional  wall motion abnormalities. There is mild left ventricular hypertrophy of  the basal-septal segment. Left  ventricular diastolic parameters are indeterminate.   2. Right ventricular systolic function is normal. The right ventricular  size is normal. Tricuspid regurgitation signal is inadequate for assessing  PA pressure.   3. The mitral valve is normal in structure. No evidence of mitral valve  regurgitation. No evidence of mitral stenosis.   4. The aortic valve is normal in structure. Aortic valve regurgitation is  not visualized. No aortic stenosis is present.   5. The inferior vena cava is normal in size with greater than 50%  respiratory variability, suggesting right atrial pressure of 3 mmHg.  - Will need TEE as the above TTE does not fully rule out a cardioembolic source for the patient's 2 acute strokes in separate vascular territories.   Electronically signed: Dr. Caryl Pina

## 2022-05-28 NOTE — Progress Notes (Signed)
No change in NIH nor neuro checks since 0940 assessment this morning

## 2022-05-28 NOTE — Evaluation (Signed)
Speech Language Pathology Evaluation Patient Details Name: Jacqueline Steele MRN: [id number] DOB: 09-Nov-1960 Today's Date: 05/28/2022 Time: 0820-0855 SLP Time Calculation (min) (ACUTE ONLY): 35 min  Problem List:  Patient Active Problem List   Diagnosis Date Noted   Acute stroke due to ischemia (HCC) 05/27/2022   Obesity (BMI 30-39.9) 05/27/2022   Past Medical History: No past medical history on file. Past Surgical History:  Past Surgical History:  Procedure Laterality Date   TUBAL LIGATION     HPI:  Per H&P "Jacqueline Steele is a 61 y.o. female with no significant medical history who presents to the hospital with altered mental status, dizziness, and a right-sided weakness started 7 PM yesterday.     Patient was mowing her lawn yesterday evening, when she she felt dizzy and lightheaded suddenly, she also feels so weak on the right side, she could not walk.  She was also confused intermittently.  Her dizziness appears to be better with time, but she still feels weak even today in her right leg.     Upon arriving in the hospital, her blood pressure was 127/77, heart rate 78, temperature 98.1.  She had a normal renal function, glucose 104, no leukocytosis.  CT head without contrast showed a left thalamic possible ischemic stroke.  Neurology consult was obtained."   Assessment / Plan / Recommendation Clinical Impression  Pt seen for speech/language evaluation. Pt alert, pleasant, and cooperative. Pt tearful at times. Notable dysarthria and paucity of speech noted.  Evaluation completed via informal means and portions of Western Aphasia Battery Revised (Bedside Record Form). 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 Pt's speech is c/b reduced vocal loudness, paucity of speech, anomia with inconsistent repair of breakdowns, and semantic paraphasias. Dysarthria with a notable impact on speech intelligibility. During with impaired responsive naming for  nouns and divergent naming. Pt strimulable for verbal hierachical cues (e.g. sentence completion, phonemic cueing). Pt with reduced auditory comprehension for complex information including complex yes/no questions and 2-step/complex commands (e.g. with temporal concepts). Pt with impaired spatial, temporal, and situational orientation as well as impaired verbal problem solving. Cognitive-linguistic assessment including orientation and verbal problem solving likely impacted by linguistic nature of task.  Based on today's assessment, recommend post-acute SLP services for above mentioned deficits. Recommend frequent/constant supervision/assistance at d/c.   SLP to f/u per POC for continued speech/language tx targeting functional communication.   Pt made aware of results of assessment, changes to functional communication following stroke, basic communication strategies, and SLP POC. Pt verbalized understanding; however, may benefit from reinforcement of content.  RN made aware of results of assessment, recommendations, communication strategies, and SLP POC.     SLP Assessment  SLP Recommendation/Assessment: Patient needs continued Speech Lanaguage Pathology Services SLP Visit Diagnosis: Dysarthria and anarthria (R47.1);Aphasia (R47.01);Cognitive communication deficit (R41.841)    Recommendations for follow up therapy are one component of a multi-disciplinary discharge planning process, led by the attending physician.  Recommendations may be updated based on patient status, additional functional criteria and insurance authorization.    Follow Up Recommendations  Outpatient SLP    Assistance Recommended at Discharge  Frequent or constant Supervision/Assistance  Functional Status Assessment Patient has had a recent decline in their functional status and demonstrates the ability to make significant improvements in function in a reasonable and predictable amount of time.  Frequency and Duration min  2x/week  2 weeks      SLP Evaluation Cognition  Overall Cognitive Status: Impaired/Different from baseline Arousal/Alertness:  Awake/alert Orientation Level: Oriented to person (hospital in "[place]"; "1973"; "my thyroid is acting up") Problem Solving: Impaired Problem Solving Impairment: Verbal basic Behaviors: Lability Safety/Judgment: Impaired       Comprehension  Auditory Comprehension Overall Auditory Comprehension: Impaired Yes/No Questions: Impaired Complex Questions: 75-100% accurate Commands: Impaired Two Step Basic Commands: 50-74% accurate Complex Commands: 50-74% accurate Other Conversation Comments: 10/10 yes/no questions following short paragraph Visual Recognition/Discrimination Discrimination: Not tested Reading Comprehension Reading Status: Not tested    Expression Expression Primary Mode of Expression: Verbal Verbal Expression Overall Verbal Expression: Impaired Initiation: No impairment Automatic Speech:  (WFL) Level of Generative/Spontaneous Verbalization: Sentence;Conversation Repetition: No impairment Naming: Impairment Responsive: 51-75% accurate Confrontation: Within functional limits Divergent:  (5 animals in 60s) Verbal Errors: Semantic paraphasias Pragmatics: Impairment Impairments:  (lability) Effective Techniques: Semantic cues;Sentence completion;Phonemic cues Written Expression Written Expression: Not tested   Oral / Motor  Oral Motor/Sensory Function Overall Oral Motor/Sensory Function: Mild impairment Facial Symmetry: Abnormal symmetry right (R facial droop vs natual asymmetry) Facial Strength: Within Functional Limits Lingual ROM: Within Functional Limits Lingual Symmetry: Within Functional Limits Lingual Strength: Within Functional Limits Mandible: Within Functional Limits Motor Speech Overall Motor Speech: Impaired Respiration: Impaired Level of Impairment: Word Phonation: Low vocal intensity Resonance: Within  functional limits Articulation: Within functional limitis Intelligibility: Intelligibility reduced Conversation: 75-100% accurate Motor Planning: Witnin functional limits           Clyde Canterbury, M.S., CCC-SLP Speech-Language Pathologist [facility] [phone] (ASCOM)  Woodroe Chen 05/28/2022, 1:21 PM

## 2022-05-28 NOTE — Progress Notes (Addendum)
  Progress Note   Patient: Jacqueline Steele MMH:[id number] DOB: 1961/06/23 DOA: 05/27/2022     1 DOS: the patient was seen and examined on 05/28/2022   Brief hospital course: Sandhya Denherder is a 61 y.o. female with no significant medical history who presents to the hospital with altered mental status, dizziness, and a right-sided weakness started 7 PM before admission. CT head showed left thalamic stroke. MRI showed 15 mm acute infarct left thalamus. Punctate acute infarct right frontal white matter. Mild chronic microvascular ischemic change in the white matter.  Carotid ultrasound did not show significant occlusion.  LDL 103, patient is placed on aspirin and Lipitor.  Also started on Plavix.   Assessment and Plan: Acute left thalamic stroke. Discussed with Dr. Otelia Limes, suspect patient may have embolic stroke, TTE is pending, if negative for clots, recommended TEE.  Will obtain consult from cardiology. Patient also will need a ZIO if TEE is also negative. Continue aspirin, Plavix and statin.   Obesity with BMI 38.97. Diet exercise advised.  Enlarged thyroid. Incidental finding on CT scan, patient is aware and is followed by her PCP for this.       Subjective:  Patient doing better today, but still has some right-sided weakness.  Physical Exam: Vitals:   05/28/22 0013 05/28/22 0408 05/28/22 0728 05/28/22 1108  BP: 109/63 127/77 123/74 108/65  Pulse: 72 74 70 71  Resp: 16 16 18 18   Temp: 97.9 F (36.6 C) 97.8 F (36.6 C) 98.3 F (36.8 C) 98.8 F (37.1 C)  TempSrc:      SpO2: 98% 90% 99% 100%  Weight:      Height:       General exam: Appears calm and comfortable  Respiratory system: Clear to auscultation. Respiratory effort normal. Cardiovascular system: S1 & S2 heard, RRR. No JVD, murmurs, rubs, gallops or clicks. No pedal edema. Gastrointestinal system: Abdomen is nondistended, soft and nontender. No organomegaly or masses felt. Normal bowel sounds heard. Central nervous  system: Alert and oriented. Right leg weakness Extremities: Symmetric 5 x 5 power. Skin: No rashes, lesions or ulcers Psychiatry: Judgement and insight appear normal. Mood & affect appropriate.   Data Reviewed:  Reviewed MRI results and CT scan, all lab results.  Family Communication: None  Disposition: Status is: Inpatient Remains inpatient appropriate because: Severity of disease and inpatient work-up.  Planned Discharge Destination: Home with Home Health    Time spent: 35 minutes  Author: , MD 05/28/2022 11:46 AM  For on call review [email or web address].

## 2022-05-28 NOTE — Plan of Care (Signed)

## 2022-05-28 NOTE — Progress Notes (Signed)
*  PRELIMINARY RESULTS* Echocardiogram 2D Echocardiogram has been performed.  Jacqueline Steele 05/28/2022, 8:14 AM

## 2022-05-28 NOTE — TOC Initial Note (Signed)
Transition of Care [facility]) - Initial/Assessment Note    Patient Details  Name: Jacqueline Steele MRN: [id number] Date of Birth: 1961/02/06  Transition of Care [facility]) CM/SW Contact:    Truddie Hidden, RN Phone Number: 05/28/2022, 12:49 PM  Clinical Narrative:                  Transition of Care [facility]) Screening Note   Patient Details  Name: Jacqueline Steele Date of Birth: 06-Apr-1961   Transition of Care [facility]) CM/SW Contact:    Truddie Hidden, RN Phone Number: 05/28/2022, 12:49 PM    Transition of Care Department [facility]) has reviewed patient and no TOC needs have been identified at this time. We will continue to monitor patient advancement through interdisciplinary progression rounds. If new patient transition needs arise, please place a TOC consult.          Patient Goals and CMS Choice        Expected Discharge Plan and Services                                                Prior Living Arrangements/Services                       Activities of Daily Living Home Assistive Devices/Equipment: None ADL Screening (condition at time of admission) Patient's cognitive ability adequate to safely complete daily activities?: Yes Is the patient deaf or have difficulty hearing?: No Does the patient have difficulty seeing, even when wearing glasses/contacts?: No Does the patient have difficulty concentrating, remembering, or making decisions?: No Patient able to express need for assistance with ADLs?: Yes Does the patient have difficulty dressing or bathing?: No Independently performs ADLs?: Yes (appropriate for developmental age) Does the patient have difficulty walking or climbing stairs?: Yes Weakness of Legs: Right Weakness of Arms/Hands: None  Permission Sought/Granted                  Emotional Assessment              Admission diagnosis:  Cerebrovascular accident (CVA), unspecified mechanism (HCC) [I63.9] Acute stroke due to ischemia  [facility]) [I63.9] Patient Active Problem List   Diagnosis Date Noted   Acute stroke due to ischemia (HCC) 05/27/2022   Obesity (BMI 30-39.9) 05/27/2022   PCP:  Services, [facility]:   [facility] [address], [place] - [address] [place] [address] Phone: [phone] Fax: [phone]     Social Determinants of Health (SDOH) Interventions    Readmission Risk Interventions     No data to display

## 2022-05-28 NOTE — Progress Notes (Signed)
PT Cancellation Note  Patient Details Name: Jacqueline Steele MRN: [id number] DOB: Nov 26, 1960   Cancelled Treatment:    Reason Eval/Treat Not Completed: Fatigue/lethargy limiting ability to participate. Attempted to see patient she was sleeping soundly. Will return this pm.    Junior Kenedy 05/28/2022, 11:42 AM

## 2022-05-28 NOTE — Hospital Course (Addendum)
Jacqueline Steele is a 61 y.o. female with no significant medical history who presents to the hospital with altered mental status, dizziness, and a right-sided weakness started 7 PM before admission. CT head showed left thalamic stroke. MRI showed 15 mm acute infarct left thalamus. Punctate acute infarct right frontal white matter. Mild chronic microvascular ischemic change in the white matter.  Carotid ultrasound did not show significant occlusion.  LDL 103, patient is placed on aspirin and Lipitor.  Also started on Plavix.  Due to concerns of embolic stroke, TEE was performed, did not show any vegetation.  Ejection fraction is normal.  ZIO will be set up.  Patient be followed by cardiology, neurology and PCP as outpatient.

## 2022-05-28 NOTE — Evaluation (Signed)
Occupational Therapy Evaluation Patient Details Name: Jacqueline Steele MRN: [id number] DOB: 12-23-1960 Today's Date: 05/28/2022   History of Present Illness 61 y.o. female with no significant medical history who presents to the hospital with altered mental status, dizziness, and a right-sided weakness started 7 PM before admission. CT head showed left thalamic stroke.  MRI showed 15 mm acute infarct left thalamus. Punctate acute infarct right frontal white matter. Mild chronic microvascular ischemic change in the white matter.   Clinical Impression   Patient presenting with decreased Ind in self care, balance, functional mobility/transfers, endurance, and safety awareness. Patient reports being Ind in all aspects of care and lives alone PTA.She was cutting her grass at home when symptoms started. Pt with expressive difficulties and slow deliberate speech during session. Pt with decreased coordination and strength in R UE and needing forced use during this assessment. Pt ambulates without RW with min guard- min A into bathroom. Pt needing min guard for balance with hygiene and clothing management. RW utilized and pt progressing to close supervision. She becomes tearful during session when she returns to sit on EOB. Her son has told her she can stay with him but she wants to return home. Pt will need 24/7 supervision for safety regardless of if she returns to her home or stays with son.  Patient will benefit from acute OT to increase overall independence in the areas of ADLs, functional mobility, and safety awareness in order to safely discharge home with family.      Recommendations for follow up therapy are one component of a multi-disciplinary discharge planning process, led by the attending physician.  Recommendations may be updated based on patient status, additional functional criteria and insurance authorization.   Follow Up Recommendations  Home health OT    Assistance Recommended at Discharge  Frequent or constant Supervision/Assistance  Patient can return home with the following A little help with walking and/or transfers;A little help with bathing/dressing/bathroom;Assistance with cooking/housework;Help with stairs or ramp for entrance;Assist for transportation;Direct supervision/assist for medications management;Direct supervision/assist for financial management    Functional Status Assessment  Patient has had a recent decline in their functional status and demonstrates the ability to make significant improvements in function in a reasonable and predictable amount of time.  Equipment Recommendations  Other (comment) (RW)       Precautions / Restrictions Precautions Precautions: Fall      Mobility Bed Mobility Overal bed mobility: Modified Independent Bed Mobility: Supine to Sit, Sit to Supine     Supine to sit: HOB elevated Sit to supine: HOB elevated        Transfers Overall transfer level: Needs assistance Equipment used: 1 person hand held assist, Rolling walker (2 wheels) Transfers: Sit to/from Stand, Bed to chair/wheelchair/BSC Sit to Stand: Min guard, Min assist     Step pivot transfers: Min assist            Balance Overall balance assessment: Needs assistance Sitting-balance support: Feet supported Sitting balance-Leahy Scale: Good     Standing balance support: During functional activity, Single extremity supported Standing balance-Leahy Scale: Fair                             ADL either performed or assessed with clinical judgement   ADL Overall ADL's : Needs assistance/impaired     Grooming: Wash/dry hands;Wash/dry face;Standing;Min guard;Cueing for safety  Toilet Transfer: Minimal assistance;Rolling walker (2 wheels);Ambulation   Toileting- Clothing Manipulation and Hygiene: Minimal assistance;Sit to/from stand       Functional mobility during ADLs: Min guard;Minimal  assistance;Supervision/safety;Rolling walker (2 wheels) General ADL Comments: min A without use of AD and min guard progressing to close supervision with use of RW     Vision Baseline Vision/History: 1 Wears glasses Patient Visual Report: No change from baseline              Pertinent Vitals/Pain Pain Assessment Pain Assessment: No/denies pain     Hand Dominance Right   Extremity/Trunk Assessment Upper Extremity Assessment Upper Extremity Assessment: RUE deficits/detail RUE Deficits / Details: 3+/5 RUE Coordination: decreased fine motor   Lower Extremity Assessment Lower Extremity Assessment: Defer to PT evaluation       Communication Communication Communication: Expressive difficulties   Cognition Arousal/Alertness: Awake/alert Behavior During Therapy: Flat affect Overall Cognitive Status: Impaired/Different from baseline                                 General Comments: Pt follows 1 step commands with increased time to process                Home Living Family/patient expects to be discharged to:: Private residence Living Arrangements: Alone Available Help at Discharge: Family;Available 24 hours/day (can live with son) Type of Home: House Home Access: Stairs to enter [employer] of Steps: 3-4 Entrance Stairs-Rails: Right;Left;Can reach both Home Layout: One level     Bathroom Shower/Tub: Tub/shower unit         Home Equipment: None      Lives With: Alone    Prior Functioning/Environment Prior Level of Function : Independent/Modified Independent                        OT Problem List: Decreased strength;Decreased coordination;Decreased cognition;Decreased activity tolerance;Decreased safety awareness;Impaired balance (sitting and/or standing);Decreased knowledge of use of DME or AE;Decreased knowledge of precautions      OT Treatment/Interventions: Self-care/ADL training;Therapeutic exercise;Therapeutic  activities;Energy conservation;Neuromuscular education;Cognitive remediation/compensation;Manual therapy;Balance training;Patient/family education;DME and/or AE instruction    OT Goals(Current goals can be found in the care plan section) Acute Rehab OT Goals Patient Stated Goal: to return to PLOF OT Goal Formulation: With patient Time For Goal Achievement: 06/11/22 Potential to Achieve Goals: Fair ADL Goals Pt Will Perform Grooming: with supervision;standing Pt Will Perform Lower Body Dressing: with supervision;sit to/from stand Pt Will Transfer to Toilet: with supervision;ambulating Pt Will Perform Toileting - Clothing Manipulation and hygiene: with supervision;sit to/from stand  OT Frequency: Min 2X/week       AM-PAC OT "6 Clicks" Daily Activity     Outcome Measure Help from another person eating meals?: A Little Help from another person taking care of personal grooming?: A Little Help from another person toileting, which includes using toliet, bedpan, or urinal?: A Little Help from another person bathing (including washing, rinsing, drying)?: A Little Help from another person to put on and taking off regular upper body clothing?: A Little Help from another person to put on and taking off regular lower body clothing?: A Little 6 Click Score: 18   End of Session Equipment Utilized During Treatment: Rolling walker (2 wheels) Nurse Communication: Mobility status  Activity Tolerance: Patient tolerated treatment well Patient left: in bed;with call bell/phone within reach;with bed alarm set  OT Visit Diagnosis: Hemiplegia and hemiparesis;Muscle weakness (generalized) (  M62.81);Unsteadiness on feet (R26.81) Hemiplegia - Right/Left: Right Hemiplegia - dominant/non-dominant: Dominant Hemiplegia - caused by: Cerebral infarction                Time: 1478-2956 OT Time Calculation (min): 26 min Charges:  OT General Charges $OT Visit: 1 Visit OT Evaluation $OT Eval Moderate Complexity: 1  Mod OT Treatments $Self Care/Home Management : 8-22 mins  Jackquline Denmark, MS, OTR/L , CBIS ascom [phone]  05/28/22, 1:42 PM

## 2022-05-28 NOTE — Evaluation (Signed)
Physical Therapy Evaluation Patient Details Name: Jacqueline Steele MRN: [id number] DOB: [birth year] Today's Date: 05/28/2022  History of Present Illness  61 y.o. female with no significant medical history who presents to the hospital with altered mental status, dizziness, and a right-sided weakness started 7 PM before admission. CT head showed left thalamic stroke.  MRI showed 15 mm acute infarct left thalamus. Punctate acute infarct right frontal white matter. Mild chronic microvascular ischemic change in the white matter.  Clinical Impression  Patient received in bed, she is agreeable to PT assessment. Patient Is mod independent with bed mobility, Transfers with min guard. Patient ambulated 120 feet with RW. No lob. Slow pace. She has flat affect, minimal speech with low tone and some dysarthria. Patient will continue to benefit fom skilled PT while here to improve functional independence and safety with mobility.        Recommendations for follow up therapy are one component of a multi-disciplinary discharge planning process, led by the attending physician.  Recommendations may be updated based on patient status, additional functional criteria and insurance authorization.  Follow Up Recommendations Outpatient PT      Assistance Recommended at Discharge Frequent or constant Supervision/Assistance  Patient can return home with the following  A little help with walking and/or transfers;A little help with bathing/dressing/bathroom;Assist for transportation;Help with stairs or ramp for entrance;Direct supervision/assist for medications management;Assistance with cooking/housework    Equipment Recommendations Rolling walker (2 wheels)  Recommendations for Other Services       Functional Status Assessment Patient has had a recent decline in their functional status and demonstrates the ability to make significant improvements in function in a reasonable and predictable amount of time.      Precautions / Restrictions Precautions Precautions: Fall Precaution Comments: mod fall Restrictions Weight Bearing Restrictions: No      Mobility  Bed Mobility Overal bed mobility: Modified Independent Bed Mobility: Supine to Sit, Sit to Supine     Supine to sit: Modified independent (Device/Increase time), HOB elevated Sit to supine: Modified independent (Device/Increase time), HOB elevated        Transfers Overall transfer level: Needs assistance Equipment used: Rolling walker (2 wheels) Transfers: Sit to/from Stand Sit to Stand: Min guard                Ambulation/Gait Ambulation/Gait assistance: Min guard Gait Distance (Feet): 110 Feet Assistive device: Rolling walker (2 wheels) Gait Pattern/deviations: Step-to pattern, Decreased step length - right, Decreased step length - left, Decreased stride length Gait velocity: decr     General Gait Details: patient generally slow and steady with RW, min guard. Reports fatigue after about 45'  Stairs            Wheelchair Mobility    Modified Rankin (Stroke Patients Only)       Balance Overall balance assessment: Needs assistance Sitting-balance support: Feet supported Sitting balance-Leahy Scale: Good     Standing balance support: Bilateral upper extremity supported, During functional activity, Reliant on assistive device for balance Standing balance-Leahy Scale: Fair                               Pertinent Vitals/Pain Pain Assessment Pain Assessment: No/denies pain    Home Living Family/patient expects to be discharged to:: Private residence Living Arrangements: Alone Available Help at Discharge: Family;Available 24 hours/day Type of Home: House Home Access: Stairs to enter Entrance Stairs-Rails: Right;Left;Can reach both Entrance Stairs-Number of Steps: 3-4  Home Layout: One level Home Equipment: None      Prior Function Prior Level of Function : Independent/Modified  Independent                     Hand Dominance   Dominant Hand: Right    Extremity/Trunk Assessment   Upper Extremity Assessment Upper Extremity Assessment: RUE deficits/detail RUE Deficits / Details: 3+/5 RUE Coordination: decreased fine motor    Lower Extremity Assessment Lower Extremity Assessment: Defer to PT evaluation       Communication   Communication: Expressive difficulties  Cognition Arousal/Alertness: Awake/alert Behavior During Therapy: Flat affect Overall Cognitive Status: Impaired/Different from baseline                                 General Comments: Pt follows 1 step commands with increased time to process        General Comments      Exercises     Assessment/Plan    PT Assessment Patient needs continued PT services  PT Problem List Decreased strength;Decreased mobility;Decreased activity tolerance       PT Treatment Interventions DME instruction;Therapeutic exercise;Gait training;Balance training;Stair training;Functional mobility training;Therapeutic activities;Patient/family education;Neuromuscular re-education    PT Goals (Current goals can be found in the Care Plan section)  Acute Rehab PT Goals Patient Stated Goal: none stated Time For Goal Achievement: 06/04/22    Frequency 7X/week     Co-evaluation               AM-PAC PT "6 Clicks" Mobility  Outcome Measure Help needed turning from your back to your side while in a flat bed without using bedrails?: A Little Help needed moving from lying on your back to sitting on the side of a flat bed without using bedrails?: A Little Help needed moving to and from a bed to a chair (including a wheelchair)?: A Little Help needed standing up from a chair using your arms (e.g., wheelchair or bedside chair)?: A Little Help needed to walk in hospital room?: A Little Help needed climbing 3-5 steps with a railing? : A Little 6 Click Score: 18    End of Session  Equipment Utilized During Treatment: Gait belt Activity Tolerance: Patient tolerated treatment well;Patient limited by fatigue Patient left: in bed;with call bell/phone within reach;with bed alarm set Nurse Communication: Mobility status PT Visit Diagnosis: Muscle weakness (generalized) (M62.81);Difficulty in walking, not elsewhere classified (R26.2)    Time: 5465-6812 PT Time Calculation (min) (ACUTE ONLY): 13 min   Charges:   PT Evaluation $PT Eval Low Complexity: 1 Low          Akash Winski, PT, GCS 05/28/22,2:45 PM

## 2022-05-29 ENCOUNTER — Inpatient Hospital Stay (HOSPITAL_COMMUNITY)
Admit: 2022-05-29 | Discharge: 2022-05-29 | Disposition: A | Discharge status: Home / Self Care | Payer: Disability Insurance | Attending: Cardiovascular Disease | Admitting: Cardiovascular Disease

## 2022-05-29 ENCOUNTER — Encounter
Admission: EM | Disposition: A | Discharge status: Home / Self Care | Payer: Self-pay | Source: Home / Self Care | Attending: Internal Medicine

## 2022-05-29 ENCOUNTER — Inpatient Hospital Stay (HOSPITAL_COMMUNITY)
Admit: 2022-05-29 | Discharge: 2022-05-29 | Disposition: A | Discharge status: Home / Self Care | Payer: Disability Insurance | Attending: Physician Assistant | Admitting: Physician Assistant

## 2022-05-29 DIAGNOSIS — I639 Cerebral infarction, unspecified: Secondary | ICD-10-CM

## 2022-05-29 DIAGNOSIS — I6389 Other cerebral infarction: Secondary | ICD-10-CM

## 2022-05-29 DIAGNOSIS — I340 Nonrheumatic mitral (valve) insufficiency: Secondary | ICD-10-CM

## 2022-05-29 HISTORY — PX: TEE WITHOUT CARDIOVERSION: SHX5443

## 2022-05-29 SURGERY — ECHOCARDIOGRAM, TRANSESOPHAGEAL
Anesthesia: Moderate Sedation

## 2022-05-29 MED ORDER — ATORVASTATIN CALCIUM 80 MG PO TABS: 80.0000 mg | ORAL_TABLET | Freq: Every day | ORAL | 0 refills | Status: DC

## 2022-05-29 MED ORDER — LIDOCAINE VISCOUS HCL 2 % MT SOLN
OROMUCOSAL | Status: AC
  Filled 2022-05-29: qty 15

## 2022-05-29 MED ORDER — CLOPIDOGREL BISULFATE 75 MG PO TABS: 75.0000 mg | ORAL_TABLET | Freq: Every day | ORAL | 0 refills | Status: AC

## 2022-05-29 MED ORDER — MIDAZOLAM HCL 2 MG/2ML IJ SOLN
INTRAMUSCULAR | Status: AC | PRN
  Administered 2022-05-29: 1 mg via INTRAVENOUS
  Administered 2022-05-29: 2 mg via INTRAVENOUS
  Administered 2022-05-29 (×3): 1 mg via INTRAVENOUS

## 2022-05-29 MED ORDER — MIDAZOLAM HCL 2 MG/2ML IJ SOLN
INTRAMUSCULAR | Status: AC
  Filled 2022-05-29: qty 2

## 2022-05-29 MED ORDER — BUTAMBEN-TETRACAINE-BENZOCAINE 2-2-14 % EX AERO
INHALATION_SPRAY | CUTANEOUS | Status: AC
  Filled 2022-05-29: qty 5

## 2022-05-29 MED ORDER — SODIUM CHLORIDE FLUSH 0.9 % IV SOLN
INTRAVENOUS | Status: AC
  Filled 2022-05-29: qty 10

## 2022-05-29 MED ORDER — MIDAZOLAM HCL 2 MG/2ML IJ SOLN
INTRAMUSCULAR | Status: AC
  Filled 2022-05-29: qty 4

## 2022-05-29 MED ORDER — ASPIRIN 81 MG PO CHEW: 81.0000 mg | CHEWABLE_TABLET | Freq: Every day | ORAL | 0 refills | Status: AC

## 2022-05-29 MED ORDER — ATORVASTATIN CALCIUM 80 MG PO TABS: 80.0000 mg | ORAL_TABLET | Freq: Every day | ORAL | 0 refills | Status: AC

## 2022-05-29 MED ORDER — FENTANYL CITRATE (PF) 100 MCG/2ML IJ SOLN
INTRAMUSCULAR | Status: AC
  Filled 2022-05-29: qty 2

## 2022-05-29 MED ORDER — FENTANYL CITRATE (PF) 100 MCG/2ML IJ SOLN
INTRAMUSCULAR | Status: AC | PRN
  Administered 2022-05-29 (×4): 25 ug via INTRAVENOUS
  Administered 2022-05-29: 50 ug via INTRAVENOUS

## 2022-05-29 MED ORDER — CLOPIDOGREL BISULFATE 75 MG PO TABS: 75.0000 mg | ORAL_TABLET | Freq: Every day | ORAL | 0 refills | Status: DC

## 2022-05-29 MED ORDER — ASPIRIN 81 MG PO CHEW: 81.0000 mg | CHEWABLE_TABLET | Freq: Every day | ORAL | 0 refills | Status: DC

## 2022-05-29 MED ORDER — SODIUM CHLORIDE 0.9 % IV SOLN: INTRAVENOUS | Status: DC

## 2022-05-29 NOTE — Discharge Summary (Signed)
Physician Discharge Summary   Patient: Jacqueline Steele MRN: [id number] DOB: 06/19/1961  Admit date:     05/27/2022  Discharge date: 05/29/22  Discharge Physician: Marrion Coy   PCP: Services, [facility]   Recommendations at discharge:   Follow-up with PCP in 1 week. Follow-up with neurology in 3 weeks. Follow-up with cardiology in 1 to 2 months for ZIO recording. Home PT/OT/speech therapy.  Discharge Diagnoses: Principal Problem:   Acute stroke due to ischemia [facility]) Active Problems:   Obesity (BMI 30-39.9)  Resolved Problems:   * No resolved hospital problems. *  Hospital Course: [name] is a 61 y.o. female with no significant medical history who presents to the hospital with altered mental status, dizziness, and a right-sided weakness started 7 PM before admission. CT head showed left thalamic stroke. MRI showed 15 mm acute infarct left thalamus. Punctate acute infarct right frontal white matter. Mild chronic microvascular ischemic change in the white matter.  Carotid ultrasound did not show significant occlusion.  LDL 103, patient is placed on aspirin and Lipitor.  Also started on Plavix.  Due to concerns of embolic stroke, TEE was performed, did not show any vegetation.  Ejection fraction is normal.  ZIO will be set up.  Patient be followed by cardiology, neurology and PCP as outpatient.   Assessment and Plan: Acute left thalamic stroke. Patient has a been evaluated by speech therapy, recommended outpatient follow-up with speech, patient will be on thin liquid and heart healthy diet. Patient was also seen by PT/OT, will be followed by outpatient therapist. TEE did not show any vegetation. Continue aspirin, Plavix and statin.   Obesity with BMI 38.97. Diet exercise advised.   Enlarged thyroid. Incidental finding on CT scan, patient is aware and is followed by her PCP for this.        Consultants: Neurology. Procedures performed: TEE  Disposition: Home  health Diet recommendation:  Discharge Diet Orders (From admission, onward)     Start     Ordered   05/29/22 0000  Diet - low sodium heart healthy        05/29/22 1232           Cardiac diet DISCHARGE MEDICATION: Allergies as of 05/29/2022   No Known Allergies      Medication List     STOP taking these medications    methocarbamol 500 MG tablet Commonly known as: ROBAXIN   naproxen 500 MG tablet Commonly known as: NAPROSYN       TAKE these medications    aspirin 81 MG chewable tablet Chew 1 tablet (81 mg total) by mouth daily.   atorvastatin 80 MG tablet Commonly known as: LIPITOR Take 1 tablet (80 mg total) by mouth daily.   clopidogrel 75 MG tablet Commonly known as: PLAVIX Take 1 tablet (75 mg total) by mouth daily for 20 days.   cyclobenzaprine 5 MG tablet Commonly known as: FLEXERIL Take 5 mg by mouth at bedtime as needed.   GNP 8 Hour Pain Reliever 650 MG CR tablet Generic drug: acetaminophen Take 1 tablet by mouth 3 (three) times daily as needed.   meloxicam 15 MG tablet Commonly known as: MOBIC Take 15 mg by mouth daily.        Follow-up Information     Services, [facility] Follow up in 1 week(s).   Contact information: [address] [place] [address] [phone]         Antonieta Iba, MD Follow up in 2 month(s).  Specialty: Cardiology Contact information: [address] [place] [address] [phone]         [facility] Follow up in 3 week(s).   Contact information: [address] [place] [address] [phone]               Discharge Exam: Ceasar Mons Weights   05/27/22 1144 05/29/22 0913  Weight: 99.8 kg 99.8 kg   General exam: Appears calm and comfortable  Respiratory system: Clear to auscultation. Respiratory effort normal. Cardiovascular system: S1 & S2 heard, RRR. No JVD, murmurs, rubs, gallops or clicks. No pedal  edema. Gastrointestinal system: Abdomen is nondistended, soft and nontender. No organomegaly or masses felt. Normal bowel sounds heard. Central nervous system: Alert and oriented. No focal neurological deficits. Extremities: Symmetric 5 x 5 power. Skin: No rashes, lesions or ulcers Psychiatry: Judgement and insight appear normal. Mood & affect appropriate.    Condition at discharge: good  The results of significant diagnostics from this hospitalization (including imaging, microbiology, ancillary and laboratory) are listed below for reference.   Imaging Studies: ECHOCARDIOGRAM COMPLETE  Result Date: 05/28/2022    ECHOCARDIOGRAM REPORT   Patient Name:   [name] Date of Exam: 05/28/2022 Medical Rec #:  [id number]       Height:       63.0 in Accession #:    [id number]      Weight:       220.0 lb Date of Birth:  Jul 02, 1961        BSA:          2.014 m Patient Age:    61 years        BP:           123/74 mmHg Patient Gender: F               HR:           70 bpm. Exam Location:  [facility] Procedure: 2D Echo, Cardiac Doppler and Color Doppler Indications:     Stroke I63.9  History:         Patient has no prior history of Echocardiogram examinations. No                  past medical history on file.  Sonographer:     Cristela Blue Referring Phys:  [id number] Marrion Coy Diagnosing Phys: Julien Nordmann MD  Sonographer Comments: Technically challenging study due to limited acoustic windows, no apical window and suboptimal subcostal window. IMPRESSIONS  1. Left ventricular ejection fraction, by estimation, is 60 to 65%. The left ventricle has normal function. The left ventricle has no regional wall motion abnormalities. There is mild left ventricular hypertrophy of the basal-septal segment. Left ventricular diastolic parameters are indeterminate.  2. Right ventricular systolic function is normal. The right ventricular size is normal. Tricuspid regurgitation signal is inadequate for assessing PA pressure.  3. The mitral  valve is normal in structure. No evidence of mitral valve regurgitation. No evidence of mitral stenosis.  4. The aortic valve is normal in structure. Aortic valve regurgitation is not visualized. No aortic stenosis is present.  5. The inferior vena cava is normal in size with greater than 50% respiratory variability, suggesting right atrial pressure of 3 mmHg. FINDINGS  Left Ventricle: Left ventricular ejection fraction, by estimation, is 60 to 65%. The left ventricle has normal function. The left ventricle has no regional wall motion abnormalities. The left ventricular internal cavity size was normal in  size. There is  mild left ventricular hypertrophy of the basal-septal segment. Left ventricular diastolic parameters are indeterminate. Right Ventricle: The right ventricular size is normal. No increase in right ventricular wall thickness. Right ventricular systolic function is normal. Tricuspid regurgitation signal is inadequate for assessing PA pressure. Left Atrium: Left atrial size was normal in size. Right Atrium: Right atrial size was normal in size. Pericardium: There is no evidence of pericardial effusion. Mitral Valve: The mitral valve is normal in structure. No evidence of mitral valve regurgitation. No evidence of mitral valve stenosis. Tricuspid Valve: The tricuspid valve is normal in structure. Tricuspid valve regurgitation is not demonstrated. No evidence of tricuspid stenosis. Aortic Valve: The aortic valve is normal in structure. Aortic valve regurgitation is not visualized. No aortic stenosis is present. Pulmonic Valve: The pulmonic valve was normal in structure. Pulmonic valve regurgitation is not visualized. No evidence of pulmonic stenosis. Aorta: The aortic root is normal in size and structure. Venous: The inferior vena cava is normal in size with greater than 50% respiratory variability, suggesting right atrial pressure of 3 mmHg. IAS/Shunts: No atrial level shunt detected by color flow  Doppler.  LEFT VENTRICLE PLAX 2D LVIDd:         3.40 cm LVIDs:         2.50 cm LV PW:         1.20 cm LV IVS:        1.50 cm LVOT diam:     2.30 cm LVOT Area:     4.15 cm  LEFT ATRIUM         Index LA diam:    3.50 cm 1.74 cm/m   AORTA Ao Root diam: 3.43 cm  SHUNTS Systemic Diam: 2.30 cm Julien Nordmann MD Electronically signed by Julien Nordmann MD Signature Date/Time: 05/28/2022/1:53:06 PM    Final    MR BRAIN WO CONTRAST  Result Date: 05/27/2022 CLINICAL DATA:  Stroke. EXAM: MRI HEAD WITHOUT CONTRAST TECHNIQUE: Multiplanar, multiecho pulse sequences of the brain and surrounding structures were obtained without intravenous contrast. COMPARISON:  CT head 05/27/2022 FINDINGS: Brain: Moderate large acute infarct left thalamus approximately 15 mm corresponding to CT hypodensity. Small acute infarct right frontal white matter. Mild white matter changes consistent with chronic microvascular ischemia. Negative for hemorrhage, mass, hydrocephalus. Vascular: Negative for hyperdense vessel Skull and upper cervical spine: Negative Sinuses/Orbits: Paranasal sinuses clear.  Negative orbit. Other: None IMPRESSION: 15 mm acute infarct left thalamus. Punctate acute infarct right frontal white matter. Mild chronic microvascular ischemic change in the white matter. Electronically Signed   By: Marlan Palau M.D.   On: 05/27/2022 18:58   CT ANGIO HEAD NECK W WO CM  Result Date: 05/27/2022 CLINICAL DATA:  Neuro deficit, stroke suspected EXAM: CT ANGIOGRAPHY HEAD AND NECK TECHNIQUE: Multidetector CT imaging of the head and neck was performed using the standard protocol during bolus administration of intravenous contrast. Multiplanar CT image reconstructions and MIPs were obtained to evaluate the vascular anatomy. Carotid stenosis measurements (when applicable) are obtained utilizing NASCET criteria, using the distal internal carotid diameter as the denominator. RADIATION DOSE REDUCTION: This exam was performed according to the  departmental dose-optimization program which includes automated exposure control, adjustment of the mA and/or kV according to patient size and/or use of iterative reconstruction technique. CONTRAST:  25mL OMNIPAQUE IOHEXOL 350 MG/ML SOLN COMPARISON:  Same-day noncontrast CT head, CT neck 11/25/2010 FINDINGS: CTA NECK FINDINGS Aortic arch: The aortic arch is normal. The origins of the major branch vessels are  patent. The subclavian arteries are patent to the level imaged. Right carotid system: The right common, internal, and external carotid arteries are patent, without hemodynamically significant stenosis or occlusion. There is no dissection or aneurysm. Left carotid system: The left common, internal, and external carotid arteries are patent, without hemodynamically significant stenosis or occlusion. There is no dissection or aneurysm. Vertebral arteries: The vertebral arteries are patent, without hemodynamically significant stenosis or occlusion. There is no dissection or aneurysm. Skeleton: There is multilevel degenerative change of the cervical spine with bulky anterior osteophytes. There is no acute osseous abnormality or suspicious osseous lesion. Is no visible canal hematoma. Other neck: Left thyroid lobe is surgically absent. The right thyroid lobe is enlarged and heterogeneous and extends into the upper mediastinum. This has been previously evaluated by ultrasound in 2012. Upper chest: The imaged lungs are clear. Review of the MIP images confirms the above findings CTA HEAD FINDINGS Anterior circulation: The intracranial ICAs are patent. The bilateral MCAs are patent. The bilateral ACAs are patent. The anterior communicating artery is normal. There is no aneurysm or AVM. Posterior circulation: The bilateral V4 segments are patent common markedly diminutive on the right after the PICA origin, likely a developmental variant. The basilar artery is patent but diminutive. The major cerebellar artery origins are  normal. The bilateral PCAs are patent, supplied by posterior communicating arteries (fetal origins). There is no aneurysm or AVM. Venous sinuses: Patent. Anatomic variants: As above. Review of the MIP images confirms the above findings IMPRESSION: 1. Patent vasculature of the head and neck with no hemodynamically significant stenosis, occlusion, or dissection. 2. Enlarged and heterogeneous right thyroid lobe extending into the upper mediastinum, previously evaluated by ultrasound 2012. Consider nonemergent repeat ultrasound. Electronically Signed   By: Lesia HausenPeter  Noone M.D.   On: 05/27/2022 16:04   CT HEAD WO CONTRAST (5MM)  Result Date: 05/27/2022 CLINICAL DATA:  Altered mental status. EXAM: CT HEAD WITHOUT CONTRAST TECHNIQUE: Contiguous axial images were obtained from the base of the skull through the vertex without intravenous contrast. RADIATION DOSE REDUCTION: This exam was performed according to the departmental dose-optimization program which includes automated exposure control, adjustment of the mA and/or kV according to patient size and/or use of iterative reconstruction technique. COMPARISON:  None Available. FINDINGS: Brain: A 1.5 cm hypodensity in the left thalamus is suspicious for an acute infarct. No acute cortically based infarct, intracranial hemorrhage, midline shift, or extra-axial fluid collection is identified. The ventricles and sulci are normal. Vascular: No hyperdense vessel. Skull: No fracture or suspicious osseous lesion. Sinuses/Orbits: Visualized paranasal sinuses and mastoid air cells are clear. Unremarkable orbits. Other: None. IMPRESSION: Suspected acute left thalamic infarct. Electronically Signed   By: Sebastian AcheAllen  Grady M.D.   On: 05/27/2022 12:39    Microbiology: No results found for this or any previous visit.  Labs: CBC: Recent Labs  Lab 05/27/22 1147 05/27/22 1955  WBC 4.7 5.8  HGB 13.7 13.2  HCT 43.4 41.7  MCV 92.9 92.5  PLT 204 206   Basic Metabolic Panel: Recent  Labs  Lab 05/27/22 1147 05/27/22 1955  NA 145  --   K 3.6  --   CL 111  --   CO2 26  --   GLUCOSE 104*  --   BUN 8  --   CREATININE 0.81 0.78  CALCIUM 8.5*  --    Liver Function Tests: Recent Labs  Lab 05/27/22 1147  AST 27  ALT 21  ALKPHOS 59  BILITOT 0.8  PROT 5.9*  ALBUMIN 3.4*   CBG: No results for input(s): "GLUCAP" in the last 168 hours.  Discharge time spent: greater than 30 minutes.  Signed: Marrion Coy, MD [facility] 05/29/2022

## 2022-05-29 NOTE — Progress Notes (Signed)
*  PRELIMINARY RESULTS* Echocardiogram Echocardiogram Transesophageal has been performed.  Cristela Blue 05/29/2022, 11:25 AM

## 2022-05-29 NOTE — TOC Transition Note (Signed)
Transition of Care [facility]) - CM/SW Discharge Note   Patient Details  Name: Jacqueline Steele MRN: [id number] Date of Birth: 01/17/1961  Transition of Care [facility] CM/SW Contact:  Truddie Hidden, RN Phone Number: 05/29/2022, 2:42 PM   Clinical Narrative:    Referral for [facility] sent to [facility] as they are on charity rotation. Patient is showing as MCD for family planning. Patient daughter, Talbert Nan rotation unable to patient. Advised DME was requested. Advised to contact DSS for more assistance with reapplying for regular medicaid. TOC signing off .         Patient Goals and CMS Choice        Discharge Placement                       Discharge Plan and Services                                     Social Determinants of Health (SDOH) Interventions     Readmission Risk Interventions     No data to display

## 2022-05-29 NOTE — Progress Notes (Signed)
Pt A/Ox4 upon review of AVS. Patients son and daughter in law reviewed AVS. Daughter in law Deanna Artis will drive patient home.  ZIO Patch placed on upper chest by RT. Instructions reviewed.  Walker delivered to bedside.  Will escort patient to entrance.

## 2022-05-29 NOTE — Progress Notes (Signed)
SLP Cancellation Note  Patient Details Name: Jacqueline Steele MRN: [id number] DOB: 1960-11-03   Cancelled treatment:       Reason Eval/Treat Not Completed: Patient at procedure or test/unavailable (chart reviewed; pt is at procedure today- TEE. Will f/u tomorrow.)     Jerilynn Som, MS, CCC-SLP Speech Language Pathologist Rehab Services; [facility] [phone] (ascom) [name] 05/29/2022, 10:40 AM

## 2022-05-29 NOTE — Progress Notes (Signed)
Occupational Therapy Treatment Patient Details Name: Jacqueline Steele MRN: [id number] DOB: 1961/03/09 Today's Date: 05/29/2022   History of present illness 61 y.o. female with no significant medical history who presents to the hospital with altered mental status, dizziness, and a right-sided weakness started 7 PM before admission. CT head showed left thalamic stroke.  MRI showed 15 mm acute infarct left thalamus. Punctate acute infarct right frontal white matter. Mild chronic microvascular ischemic change in the white matter.   OT comments  Pt seen for OT treatment on this date. Upon arrival to room, pt awake and seated upright in recliner eating lunch. Supportive daughter-in-law present throughout session. Pt currently requires SET-UP assist for seated UB dressing, SUPERVISION for sit>stand LB dressing, SUPERVISION for functional mobility of household distances with RW, and SUPERVISION for standing grooming tasks d/t decreased coordination,  decreased strength in RUE, and decreased balance. Pt and family educated on importance of continued use of RUE when at home to promote return to prior level of function, DME/AE to maximize safety and independence with ADLs, and importance of receiving assistance from family when re-engaging in IADLs (with exception from driving until cleared by provider/DMV). Pt and family verbalized understanding of education provided. Pt is making good progress toward goals and continues to benefit from skilled OT services to maximize return to PLOF and minimize risk of future falls, injury, caregiver burden, and readmission. Will continue to follow POC. Upon discharge, recommend frequent supervision from family for ADLs/functional mobility and assist from family for IADLs.    Recommendations for follow up therapy are one component of a multi-disciplinary discharge planning process, led by the attending physician.  Recommendations may be updated based on patient status, additional  functional criteria and insurance authorization.    Follow Up Recommendations  Home health OT    Assistance Recommended at Discharge Frequent or constant Supervision/Assistance  Patient can return home with the following  A little help with walking and/or transfers;A little help with bathing/dressing/bathroom;Assistance with cooking/housework;Help with stairs or ramp for entrance;Assist for transportation;Direct supervision/assist for medications management;Direct supervision/assist for financial management   Equipment Recommendations  Other (comment);Tub/shower bench (2ww)       Precautions / Restrictions Precautions Precautions: Fall Restrictions Weight Bearing Restrictions: No       Mobility Bed Mobility               General bed mobility comments: not assessed, in recliner pre/post session    Transfers Overall transfer level: Needs assistance Equipment used: Rolling walker (2 wheels)   Sit to Stand: Supervision           General transfer comment: Requires verbal cues for safe hand placement with RW use     Balance Overall balance assessment: Needs assistance Sitting-balance support: Feet supported Sitting balance-Leahy Scale: Good     Standing balance support: Single extremity supported, During functional activity, Reliant on assistive device for balance Standing balance-Leahy Scale: Fair Standing balance comment: Able to reach outside BOS to obtain grooming items.                           ADL either performed or assessed with clinical judgement   ADL Overall ADL's : Needs assistance/impaired Eating/Feeding: Independent;Sitting   Grooming: Wash/dry hands;Supervision/safety;Standing           Upper Body Dressing : Set up;Sitting Upper Body Dressing Details (indicate cue type and reason): to doff hospital gown and to don overhead dress Lower Body Dressing: Supervision/safety;Sit to/from  stand Lower Body Dressing Details (indicate cue  type and reason): to don underwear sit>stand             Functional mobility during ADLs: Min guard;Rolling walker (2 wheels) (to walk 19ft)        Cognition Arousal/Alertness: Awake/alert, Lethargic Behavior During Therapy: Flat affect Overall Cognitive Status: Impaired/Different from baseline                                 General Comments: Pt follows 1-steps commands consistently. Alert at beginning of session, but becoming lethargic as session progressed. Pt endorses decreased short term memory compared to baseline        Exercises              Pertinent Vitals/ Pain       Pain Assessment Pain Assessment: Faces Faces Pain Scale: Hurts a little bit Pain Location: head Pain Descriptors / Indicators: Aching Pain Intervention(s): Limited activity within patient's tolerance, Patient requesting pain meds-RN notified         Frequency  Min 2X/week        Progress Toward Goals  OT Goals(current goals can now be found in the care plan section)  Progress towards OT goals: Progressing toward goals  Acute Rehab OT Goals Patient Stated Goal: to go back home OT Goal Formulation: With patient/family Time For Goal Achievement: 06/11/22 Potential to Achieve Goals: Fair  Plan Discharge plan remains appropriate;Frequency remains appropriate       AM-PAC OT "6 Clicks" Daily Activity     Outcome Measure   Help from another person eating meals?: None Help from another person taking care of personal grooming?: A Little Help from another person toileting, which includes using toliet, bedpan, or urinal?: A Little Help from another person bathing (including washing, rinsing, drying)?: A Little Help from another person to put on and taking off regular upper body clothing?: A Little Help from another person to put on and taking off regular lower body clothing?: A Little 6 Click Score: 19    End of Session Equipment Utilized During Treatment: Rolling  walker (2 wheels)  OT Visit Diagnosis: Hemiplegia and hemiparesis;Muscle weakness (generalized) (M62.81);Unsteadiness on feet (R26.81) Hemiplegia - Right/Left: Right Hemiplegia - dominant/non-dominant: Dominant Hemiplegia - caused by: Cerebral infarction   Activity Tolerance Patient tolerated treatment well   Patient Left in chair;with call bell/phone within reach;with family/visitor present   Nurse Communication Mobility status        Time: 5400-8676 OT Time Calculation (min): 30 min  Charges: OT General Charges $OT Visit: 1 Visit OT Treatments $Self Care/Home Management : 23-37 mins  Matthew Folks, OTR/L [facility] [phone]

## 2022-05-29 NOTE — Progress Notes (Signed)
Transesophageal Echocardiogram :  Indication: Stroke Requesting/ordering  physician: Dr. Chipper Herb  Procedure: Benzocaine spray x2 and 2 mls x 2 of viscous lidocaine were given orally to provide local anesthesia to the oropharynx. The patient was positioned supine on the left side, bite block provided. The patient was moderately sedated with the doses of versed and fentanyl as detailed below.  Using digital technique an omniplane probe was advanced into the distal esophagus without incident.   Moderate sedation: 1. Sedation used:  Versed: 6 mg IV, Fentanyl: 150 mcg IV 2. Time administered: 10:45 AM   time when patient started recovery: Alert and 17 a.m. Total sedation time 32 minutes 3. I was face to face during this time  See report in EPIC  for complete details: In brief, transgastric imaging revealed normal LV function with no RWMAs and no mural apical thrombus.  .  Estimated ejection fraction was 55%.  Right sided cardiac chambers were normal with no evidence of pulmonary hypertension.  Imaging of the septum showed no ASD or VSD Bubble study was negative for shunt 2D and color flow confirmed no PFO  The LA was well visualized in orthogonal views.  There was no spontaneous contrast and no thrombus in the LA and LA appendage   The descending thoracic aorta had no  mural aortic debris with no evidence of aneurysmal dilation or disection   Julien Nordmann 05/29/2022 11:18 AM

## 2022-05-29 NOTE — Progress Notes (Signed)
Physical Therapy Treatment Patient Details Name: Jacqueline Steele MRN: [id number] DOB: [birth year] Today's Date: 05/29/2022   History of Present Illness 61 y.o. female with no significant medical history who presents to the hospital with altered mental status, dizziness, and a right-sided weakness started 7 PM before admission. CT head showed left thalamic stroke.  MRI showed 15 mm acute infarct left thalamus. Punctate acute infarct right frontal white matter. Mild chronic microvascular ischemic change in the white matter.    PT Comments    Pt received supine in bed agreeable to PT services. Pt's daughter present for session. Pt mod-I with bed mobility donning shoes. Requires frequent VC's for hand placement to RW able to stand with minguard stepping to recliner safely. Pt transported to stairs for stair training performing safely with safe LE sequencing. Pt declining gait due to fatigue post TEE procedure. Pt returned to room with education provided on LE therex pt can perform for RLE strengthening in case pt is  unable to receive HHPT and OPPT due to lack of insurance. Daughter and pt also educated on step navigation without rails if pt were to d/c to daughter's. Encouraged to not attempt full flight of stairs at this time due to RLE weakness and risk for falls. Pt left in care of OT at end of session. All needs in reach with d/c recs remaining appropriate.       Access Code: [email or web address] URL: [email or web address] Date: 05/29/2022 Prepared by: Ronnie Derby   Exercises - Sit to Stand with Counter Support  - 1 x daily - 3 x weekly - 3 sets - 5 reps - Seated March  - 1 x daily - 5 x weekly - 3 sets - 10 reps - Seated Long Arc Quad  - 1 x daily - 5 x weekly - 3 sets - 10 reps - Seated Heel Toe Raises  - 1 x daily - 5 x weekly - 3 sets - 10 reps      Recommendations for follow up therapy are one component of a multi-disciplinary discharge planning process, led by the attending  physician.  Recommendations may be updated based on patient status, additional functional criteria and insurance authorization.  Follow Up Recommendations  Outpatient PT     Assistance Recommended at Discharge Frequent or constant Supervision/Assistance  Patient can return home with the following A little help with walking and/or transfers;A little help with bathing/dressing/bathroom;Assist for transportation;Help with stairs or ramp for entrance;Direct supervision/assist for medications management;Assistance with cooking/housework   Equipment Recommendations  Rolling walker (2 wheels)    Recommendations for Other Services       Precautions / Restrictions Precautions Precautions: Fall Restrictions Weight Bearing Restrictions: No     Mobility  Bed Mobility Overal bed mobility: Modified Independent Bed Mobility: Supine to Sit             Patient Response: Cooperative, Flat affect  Transfers Overall transfer level: Needs assistance Equipment used: Rolling walker (2 wheels) Transfers: Sit to/from Stand Sit to Stand: Min guard           General transfer comment: frequent VC's for hand placement    Ambulation/Gait Ambulation/Gait assistance: Supervision Gait Distance (Feet): 10 Feet Assistive device: Rolling walker (2 wheels) Gait Pattern/deviations: Step-to pattern, Decreased step length - right, Decreased step length - left, Decreased stride length       General Gait Details: slow but consistent cadence with RW ambulating to recliner and to steps in PT gym   Stairs Stairs: Yes Stairs assistance:  Min guard Stair Management: Two rails, Step to pattern, Forwards Number of Stairs: 4 General stair comments: PT demo prior to completion. Safe completion with VC's for correct LE sequencing   Wheelchair Mobility    Modified Rankin (Stroke Patients Only)       Balance Overall balance assessment: Needs assistance Sitting-balance support: Feet  supported Sitting balance-Leahy Scale: Good     Standing balance support: Single extremity supported, During functional activity, Reliant on assistive device for balance Standing balance-Leahy Scale: Fair                              Cognition Arousal/Alertness: Awake/alert, Lethargic Behavior During Therapy: Flat affect Overall Cognitive Status: Within Functional Limits for tasks assessed                                 General Comments: drowsy from TEE        Exercises Other Exercises Other Exercises: See HEP attached, safe use of RW, stair completion    General Comments        Pertinent Vitals/Pain Pain Assessment Pain Assessment: No/denies pain    Home Living                          Prior Function            PT Goals (current goals can now be found in the care plan section) Acute Rehab PT Goals Patient Stated Goal: none stated    Frequency    7X/week      PT Plan Current plan remains appropriate    Co-evaluation              AM-PAC PT "6 Clicks" Mobility   Outcome Measure  Help needed turning from your back to your side while in a flat bed without using bedrails?: A Little Help needed moving from lying on your back to sitting on the side of a flat bed without using bedrails?: A Little Help needed moving to and from a bed to a chair (including a wheelchair)?: A Little Help needed standing up from a chair using your arms (e.g., wheelchair or bedside chair)?: A Little Help needed to walk in hospital room?: A Little Help needed climbing 3-5 steps with a railing? : A Little 6 Click Score: 18    End of Session Equipment Utilized During Treatment: Gait belt Activity Tolerance: Patient tolerated treatment well Patient left: in chair;with call bell/phone within reach;with chair alarm set;with family/visitor present Nurse Communication: Mobility status PT Visit Diagnosis: Muscle weakness (generalized)  (M62.81);Difficulty in walking, not elsewhere classified (R26.2)     Time: 3295-1884 PT Time Calculation (min) (ACUTE ONLY): 46 min  Charges:  $Gait Training: 23-37 mins $Therapeutic Exercise: 8-22 mins                     Tahira Olivarez M. Fairly IV, PT, DPT Physical Therapist- [place]  [facility]  05/29/2022, 3:28 PM

## 2022-06-01 ENCOUNTER — Encounter: Payer: Self-pay | Admitting: Cardiovascular Disease

## 2022-07-22 NOTE — Addendum Note (Signed)
Encounter addended by: Jeannette How on: 07/22/2022 1:27 PM  Actions taken: Imaging Exam ended

## 2022-08-06 ENCOUNTER — Telehealth: Payer: Self-pay | Admitting: *Deleted

## 2022-08-06 NOTE — Telephone Encounter (Signed)
-----   Message from Rise Mu, PA-C sent at 08/03/2022 10:04 AM EDT ----- Heart monitor showed a predominant rhythm of sinus with an average rate of 72 bpm (range 42 to 156 bpm), 30 episodes of SVT (fast heart rhythm coming from the top portion of the heart) with the fastest interval lasting 16 beats with a maximal rate of 156 bpm and the longest interval lasting 20.2 seconds, 1 pause lasting 3.7 seconds occurring at 3:43 AM, and rare extra beats from the top and bottom portions of the heart.  Recommendations: -Recommendations: Schedule appointment with MD to establish care and discuss if further testing is indicated for possible sleep apnea given nocturnal bradycardia and to discuss possible treatment of PSVT

## 2022-08-06 NOTE — Telephone Encounter (Signed)
Left voicemail message to call back for review of results.  

## 2022-08-12 ENCOUNTER — Telehealth: Payer: Self-pay | Admitting: *Deleted

## 2022-08-12 NOTE — Telephone Encounter (Signed)
Duplicate encounter.  Closing.

## 2022-08-12 NOTE — Telephone Encounter (Signed)
-----   Message from Sondra Barges, PA-C sent at 08/03/2022 10:04 AM EDT ----- Heart monitor showed a predominant rhythm of sinus with an average rate of 72 bpm (range 42 to 156 bpm), 30 episodes of SVT (fast heart rhythm coming from the top portion of the heart) with the fastest interval lasting 16 beats with a maximal rate of 156 bpm and the longest interval lasting 20.2 seconds, 1 pause lasting 3.7 seconds occurring at 3:43 AM, and rare extra beats from the top and bottom portions of the heart.  Recommendations: -Recommendations: Schedule appointment with MD to establish care and discuss if further testing is indicated for possible sleep apnea given nocturnal bradycardia and to discuss possible treatment of PSVT

## 2022-08-12 NOTE — Telephone Encounter (Signed)
Home number is not her number.  Left voicemail message on other number to call back for review of results and to schedule appointment.

## 2022-08-13 NOTE — Telephone Encounter (Signed)
Left voicemail message to call back for review of results.  

## 2022-08-14 ENCOUNTER — Encounter: Payer: Self-pay | Admitting: *Deleted

## 2022-08-14 NOTE — Telephone Encounter (Signed)
Left voicemail message to call back for review of results. Will mail letter with her results and close encounter.

## 2023-04-03 IMAGING — CR DG HIP (WITH OR WITHOUT PELVIS) 2-3V*R*
1 series · 3 of 3 positions shown · non-contrast
Comparison: None.

CLINICAL DATA: Right hip pain.

EXAM:
DG HIP (WITH OR WITHOUT PELVIS) 2-3V RIGHT

[Series 1: dg hip unilat w or w/o pelvis 2-3 views  · non-contrast · 0.14mm/px · 3 of 3 slices shown]
[im 1/3]
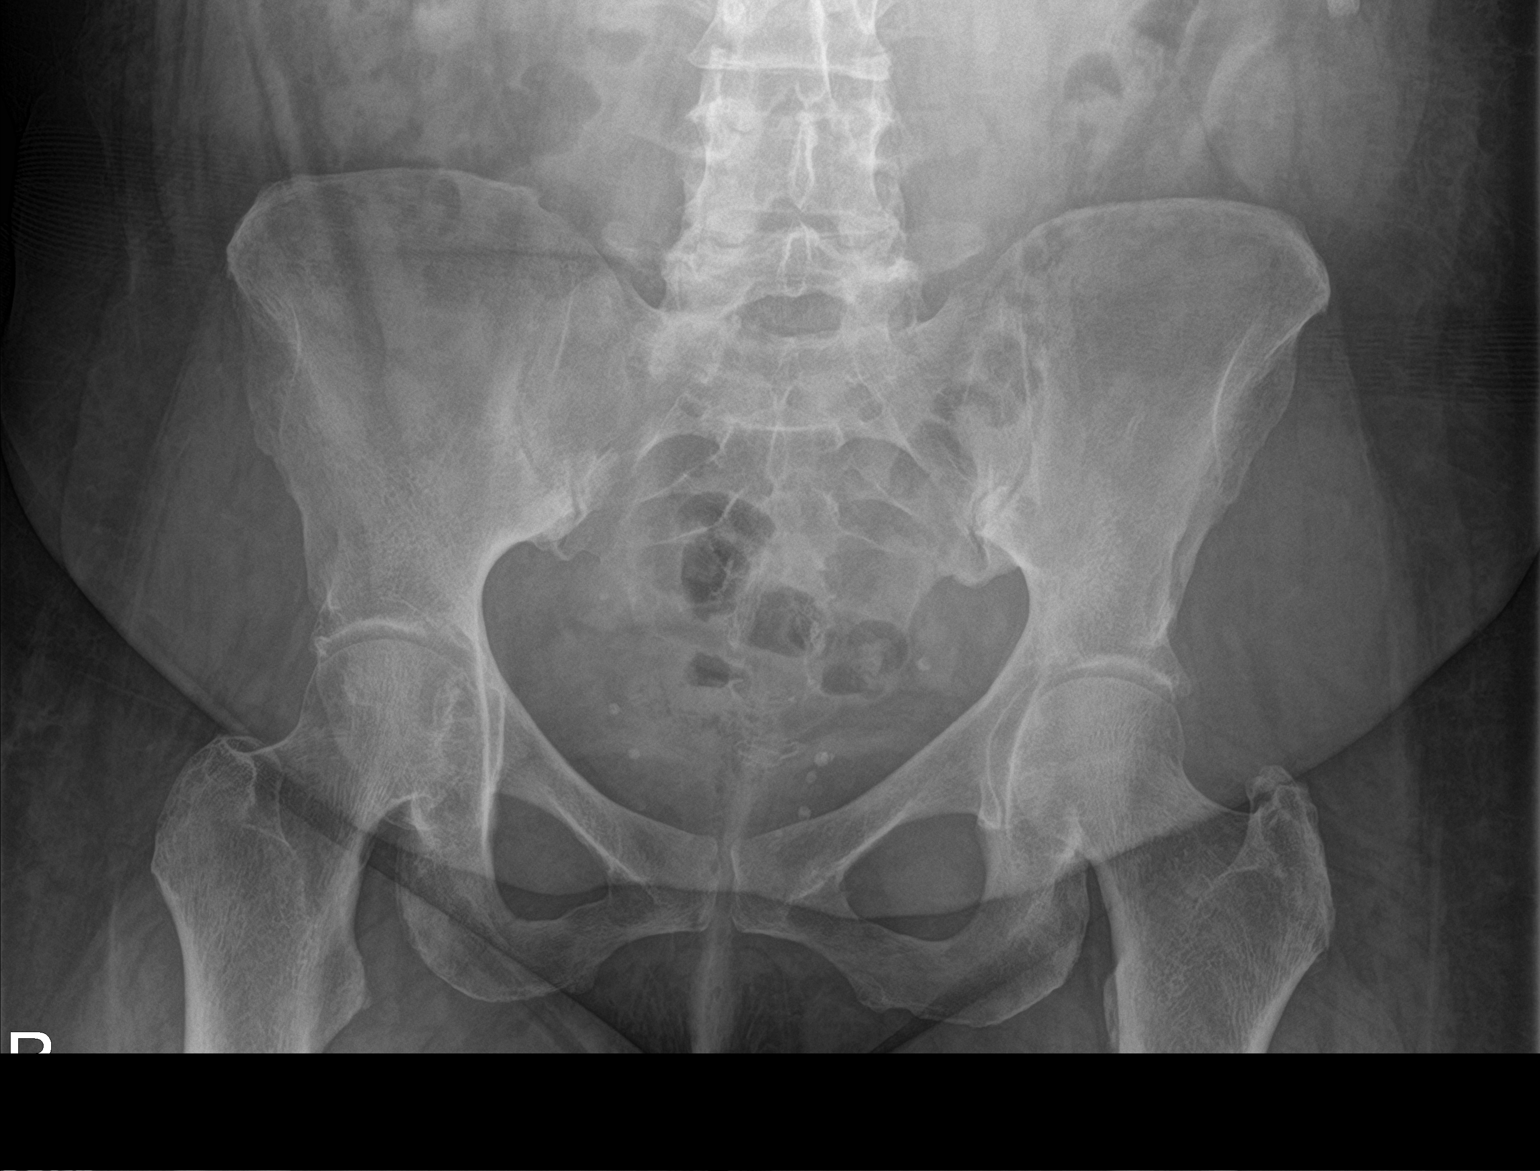
[im 2/3]
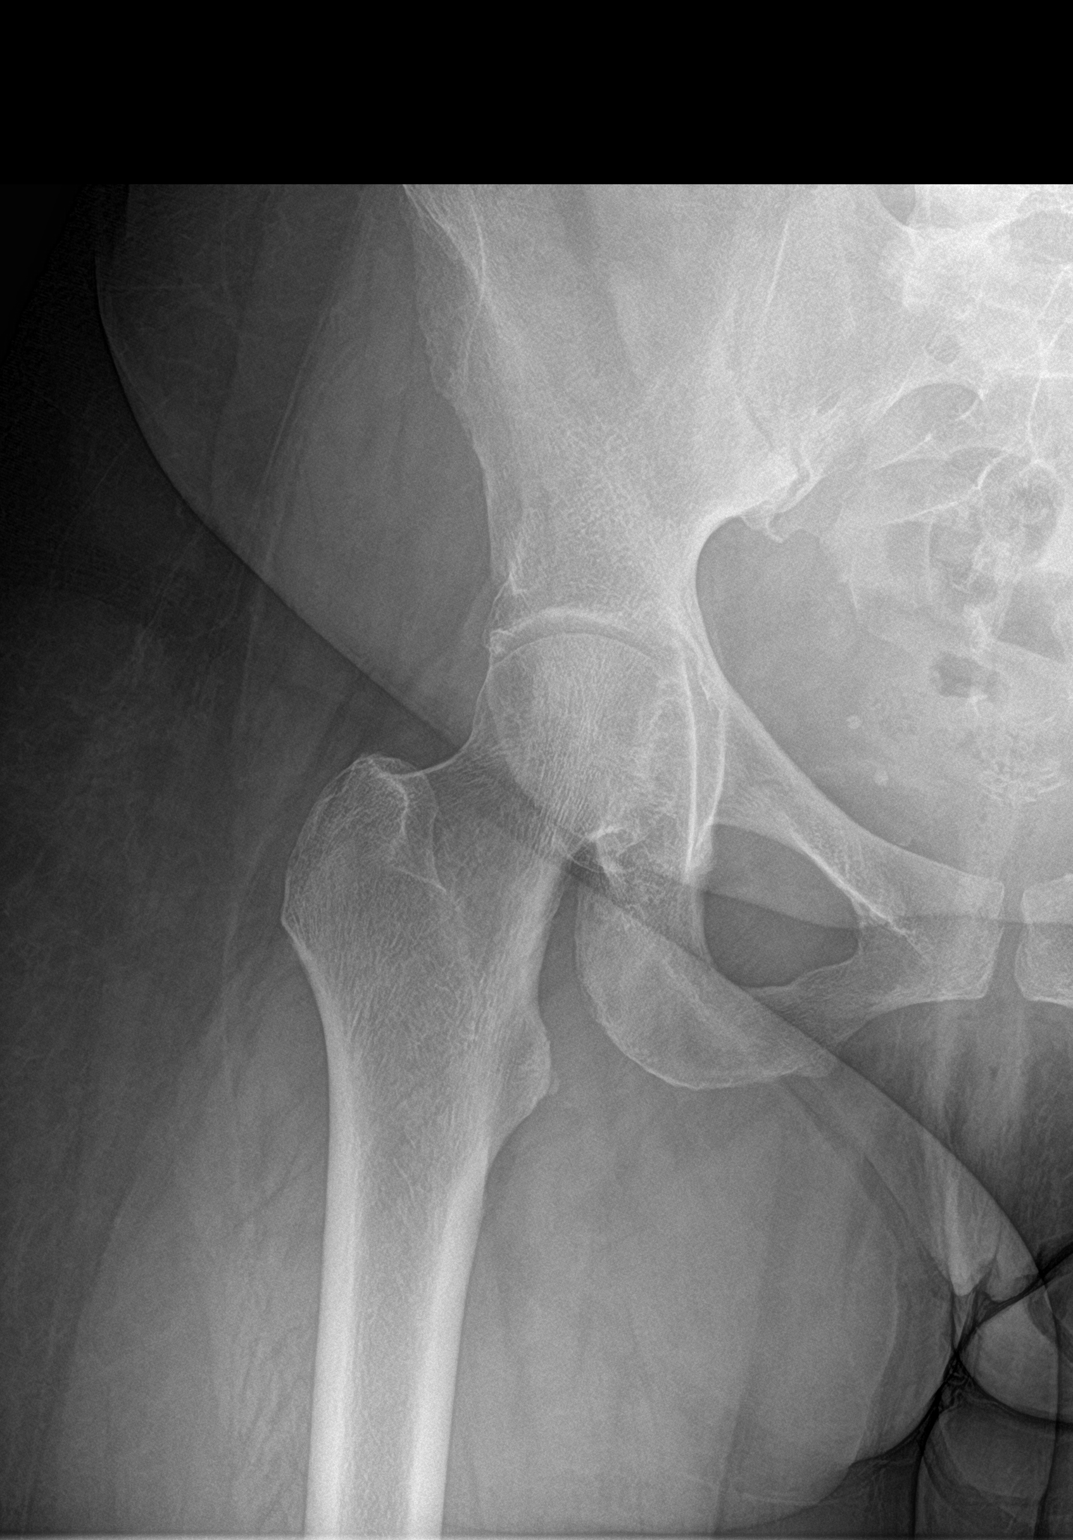
[im 3/3]
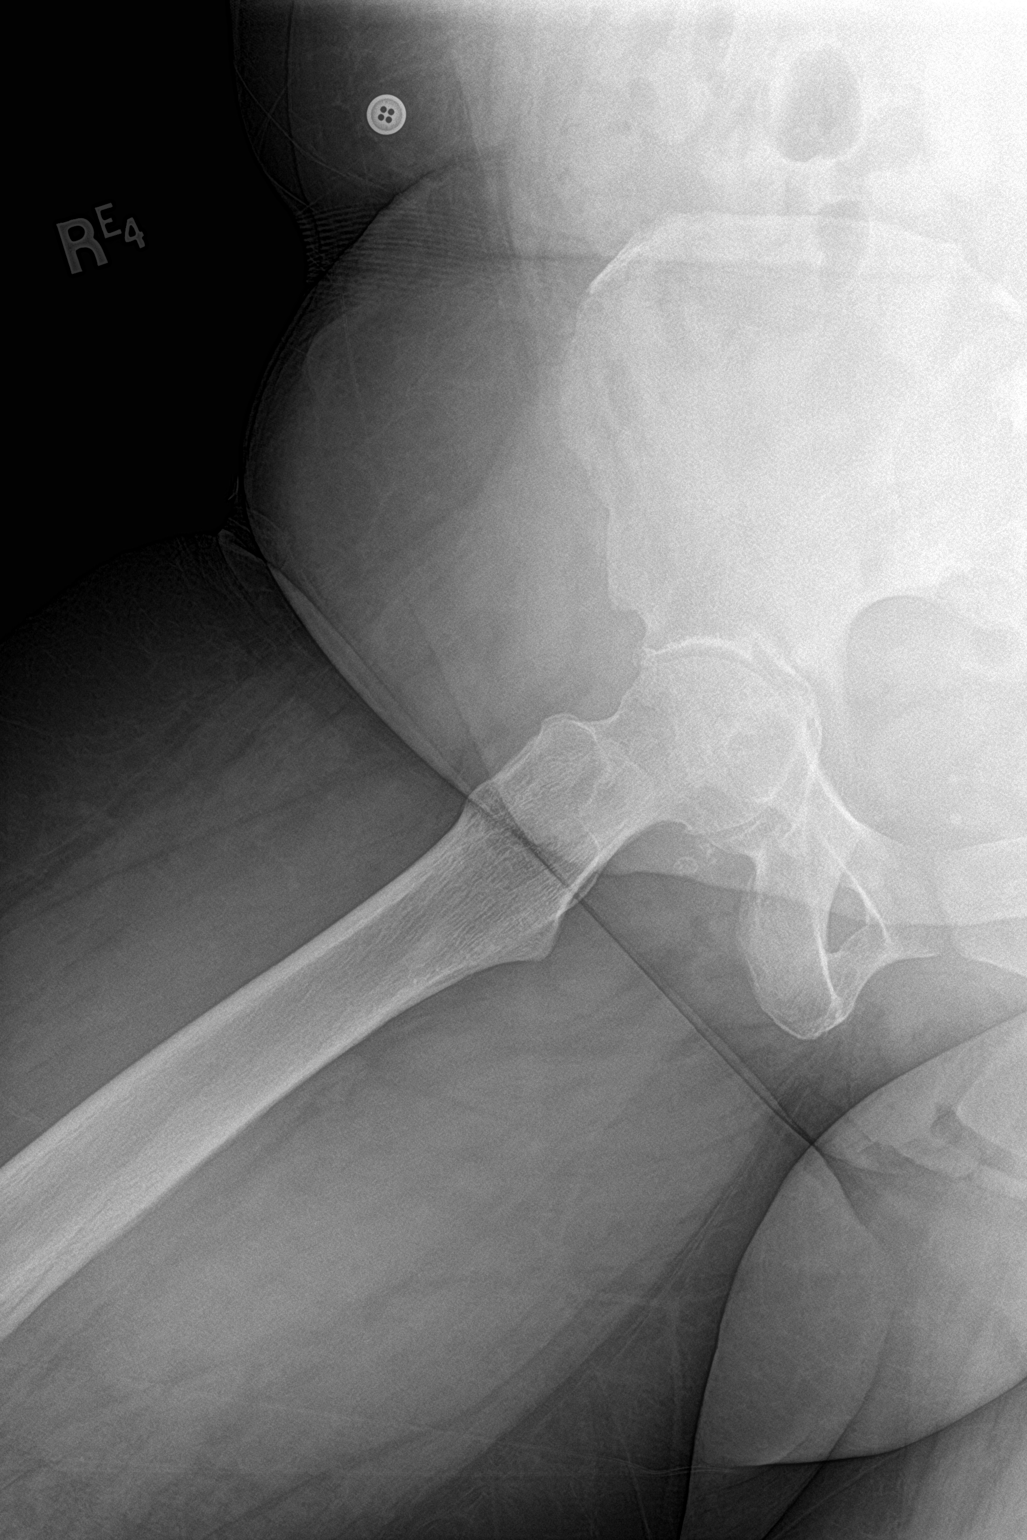

[3 of 3 positions shown; findings below may reference images not displayed]

FINDINGS: Mild right hip joint space narrowing. Acetabular and femoral head
neck osteophytes as well as subchondral cystic change. Small osseous
densities adjacent to the inferior joint space may represent
intra-articular bodies. There is no fracture or bony destruction. No
erosion. The pubic rami are intact. Minimal degenerative change of
the sacroiliac joints which remain congruent.
IMPRESSION: Mild to moderate right hip osteoarthritis. Possible intra-articular
bodies in the inferior joint.

## 2023-12-29 NOTE — Therapy (Addendum)
 OUTPATIENT PHYSICAL THERAPY EVALUATION   Patient Name: Jacqueline Steele MRN: [id number] DOB:Jul 20, 1961, 63 y.o., female Today's Date: 12/30/2023  END OF SESSION:  PT End of Session - 12/30/23 0809     Visit Number 1    Number of Visits 25    Date for PT Re-Evaluation 03/23/24    PT Start Time 0809    PT Stop Time 0845    PT Time Calculation (min) 36 min    Activity Tolerance Patient limited by pain    Behavior During Therapy [facility] for tasks assessed/performed             History reviewed. No pertinent past medical history. Past Surgical History:  Procedure Laterality Date   TEE WITHOUT CARDIOVERSION N/A 05/29/2022   Procedure: TRANSESOPHAGEAL ECHOCARDIOGRAM (TEE);  Surgeon: Antonieta Iba, MD;  Location: [facility] ORS;  Service: Cardiovascular;  Laterality: N/A;   TUBAL LIGATION     Patient Active Problem List   Diagnosis Date Noted   Acute stroke due to ischemia (HCC) 05/27/2022   Obesity (BMI 30-39.9) 05/27/2022    PCP: Olin Pia, MD  REFERRING PROVIDER: Curtis Sites, Denny Peon, MD  REFERRING DIAG:  Diagnosis  S46.011A (ICD-10-CM) - Strain of tendon of right rotator cuff    Rationale for Evaluation and Treatment: Rehabilitation  THERAPY DIAG:  Chronic right shoulder pain  Muscle weakness (generalized)  Stiffness of right shoulder, not elsewhere classified  ONSET DATE: 05/27/2022   SUBJECTIVE:                                                                                                                                                                                           SUBJECTIVE STATEMENT: The pt is pleasant 63 y/o female referred to PT for strain of tendon of R RTC that developed following CVA 05/2022. Pt CVA affected R side and that she did not receive therapy for her RUE following stroke. R side is dominant side. She cannot raise her R arm, can't lift anything due to pain. General movement hurts.  She now takes extra time to get dressed and often uses  her LUE to completed ADLs. She is doing everything by herself still, but it takes longer. Pt reports she is now on disability. Pt reports worst pain is a 6/10. If she sleeps she thinks pain may get to a 0, but otherwise notices it at all times. She describes pain as a burning sensation, "like it is being ripped."  Pt unsure of her fine motor control R hand since she is not using it much.  PERTINENT HISTORY:   PMH per chart: CVA, HTN, preDM, sleep apnea, bipolar  disorder, R hip pain, greater trochanteric pain syndrome or RLE, primary OA of R hip  PAIN:  Are you having pain?  RUE pain, worst pain is 6/10  PRECAUTIONS:  None  RED FLAGS: Does report bladder changes since stroke (increased frequency); PT advised pt to discuss with her physician, pt verbalized understanding    WEIGHT BEARING RESTRICTIONS:  No  FALLS:  Has patient fallen in last 6 months? No  LIVING ENVIRONMENT: Lives with: lives alone Lives in: House/apartment Stairs: 4 steps with handrails  Has following equipment at home: Dan Humphreys - 2 wheeled, reports not using any assistive devices   OCCUPATION:  On disability   PLOF:  Independent  PATIENT GOALS:  She'd like to be able to use her RUE, does not want to fully lose use of it.    OBJECTIVE:  Note: Objective measures were completed at Evaluation unless otherwise noted.  DIAGNOSTIC FINDINGS:  No pertinent imaging available in chart  PATIENT SURVEYS:  Quick Dash 61  COGNITIVE STATUS: Within functional limits for tasks assessed   SENSATION: Pt can feel a numbness/burning in her R arm Pt sensation intact to light touch BUE   Coordination: WFL rapid alt UE and chin to target  EDEMA:  Pt reports no swelling  POSTURE:  rounded shoulders, slight increase thoracic kyphosis   UE MMT: LUE: 4 to 4+/5 and no pain RUE:  4-/5 * very pain limited flexion, abduction and IR; ER is only mm group not painful  Grip strength LUE 4/5 RUE 4+/5   UE  AROM:  LUE: Flexion 160 deg Abduction 126 deg IR [facility]  ER needs formally assessment appears limited  RUE: Flexion 64 deg Abduction 62 deg IR [facility] ER needs formal assessment appears limited    HAND DOMINANCE:  Right                                                                                                                             TREATMENT DATE: 12/30/23  TA: Reviewed assessment findings, plan, prognosis, goals Reviewed HEP with pt performing one set of each in session to confirm technique: Access Code: [email or web address] URL: [email or web address] Date: 12/30/2023 Prepared by: Temple Pacini  Exercises - Putty Squeezes  - 2 x daily - 5-7 x weekly - 2 sets - 1 reps - 60 sec  hold - Finger Lumbricals with Putty  - 2 x daily - 5-7 x weekly - 2 sets - 1 reps - 60 sec hold - Seated Scapular Retraction  - 1 x daily - 7 x weekly - 2 sets - 10 reps - Seated Shoulder Shrug Circles AROM Forward  - 1 x daily - 7 x weekly - 2 sets - 10 reps    PATIENT EDUCATION:  Education details: assessment findings, plan, prognosis, goals Person educated: Patient Education method: Explanation, Demonstration, Verbal cues, and Handouts Education comprehension: verbalized understanding and returned demonstration  HOME EXERCISE PROGRAM: Access Code: [email or web address] URL: [email or web address] Date: 12/30/2023 Prepared by:  Temple Pacini  Exercises - Putty Squeezes  - 2 x daily - 5-7 x weekly - 2 sets - 1 reps - 60 sec  hold - Finger Lumbricals with Putty  - 2 x daily - 5-7 x weekly - 2 sets - 1 reps - 60 sec hold - Seated Scapular Retraction  - 1 x daily - 7 x weekly - 2 sets - 10 reps - Seated Shoulder Shrug Circles AROM Forward  - 1 x daily - 7 x weekly - 2 sets - 10 reps   ASSESSMENT:  CLINICAL IMPRESSION: Patient is a pleasant 63 y.o. female who was seen today for physical therapy evaluation and treatment for chronic R shoulder pain following CVA 20203, where RUE is pt's  dominant arm. Exam indicates RUE impairments of pain, strength, ROM, mobility and overall decrease in ability to use RUE to complete ADLs. The pt will benefit from further skilled PT to improve deficits in order to decrease pain, increase strength, mobility, QOL and ADL ability.   OBJECTIVE IMPAIRMENTS: Abnormal gait, decreased activity tolerance, decreased mobility, decreased ROM, decreased strength, hypomobility, impaired flexibility, impaired sensation, impaired UE functional use, improper body mechanics, postural dysfunction, and pain.   ACTIVITY LIMITATIONS: carrying, lifting, bathing, dressing, reach over head, hygiene/grooming, and locomotion level  PARTICIPATION LIMITATIONS: meal prep, cleaning, shopping, community activity, occupation, and yard work  PERSONAL FACTORS: Age, Sex, Time since onset of injury/illness/exacerbation, and 3+ comorbidities: PMH per chart: CVA, HTN, preDM, sleep apnea, bipolar disorder, R hip pain, greater trochanteric pain syndrome or RLE, primary OA of R hip  are also affecting patient's functional outcome.   REHAB POTENTIAL: Good  CLINICAL DECISION MAKING: Evolving/moderate complexity  EVALUATION COMPLEXITY: Moderate   GOALS: Goals reviewed with patient? Yes   SHORT TERM GOALS: Target date: 02/10/2024    Patient will be independent in home exercise program to improve strength/mobility for better functional independence with ADLs. Baseline:initiated  Goal status: INITIAL   LONG TERM GOALS: Target date: 03/23/2024     Patient will decrease Quick DASH score by > 8 points demonstrating reduced self-reported upper extremity disability. Baseline: 61 Goal status: INITIAL  2.  Patient will improve R shoulder AROM to > 140 degrees of flexion, scaption, and abduction for improved ability to perform overhead activities. Baseline: flex/abd 64 deg/62 deg and pain limited Goal status: INITIAL  3.  Patient will reports a worst pain no greater than 4/10 in  the past two weeks to indicate QOL improvement. Baseline: 6/10 Goal status: INITIAL  4.  Patient will increase RUE gross strength to 4+/5 as to improve functional strength for increased ADL ability. Baseline: RUE strength grossly 4-/5 Goal status: INITIAL     PLAN:  PT FREQUENCY: 1-2x/week  PT DURATION: 12 weeks  PLANNED INTERVENTIONS: [id number]- PT Re-evaluation, 97110-Therapeutic exercises, 97530- Therapeutic activity, 97112- Neuromuscular re-education, 97535- Self Care, 46962- Manual therapy, [phone]- Gait training, [phone]- Orthotic Fit/training, [phone]- Splinting, Patient/Family education, Balance training, Stair training, Taping, Dry Needling, Joint mobilization, Spinal mobilization, Scar mobilization, DME instructions, Cryotherapy, and Moist heat.  PLAN FOR NEXT SESSION: advance HEP to include gentle strengthening if tolerated, manual, PROM, isometrics   Baird Kay, PT 12/30/2023, 10:57 AM

## 2023-12-30 ENCOUNTER — Ambulatory Visit: Attending: Internal Medicine

## 2023-12-30 DIAGNOSIS — M25511 Pain in right shoulder: Secondary | ICD-10-CM | POA: Diagnosis present

## 2023-12-30 DIAGNOSIS — G8929 Other chronic pain: Secondary | ICD-10-CM | POA: Diagnosis present

## 2023-12-30 DIAGNOSIS — M6281 Muscle weakness (generalized): Secondary | ICD-10-CM | POA: Diagnosis present

## 2023-12-30 DIAGNOSIS — M25611 Stiffness of right shoulder, not elsewhere classified: Secondary | ICD-10-CM | POA: Diagnosis present

## 2024-01-03 NOTE — Addendum Note (Signed)
 Addended by: Baird Kay on: 01/03/2024 03:14 PM   Modules accepted: Orders

## 2024-01-04 ENCOUNTER — Ambulatory Visit: Attending: Internal Medicine

## 2024-01-04 DIAGNOSIS — M6281 Muscle weakness (generalized): Secondary | ICD-10-CM | POA: Diagnosis present

## 2024-01-04 DIAGNOSIS — M25511 Pain in right shoulder: Secondary | ICD-10-CM | POA: Diagnosis present

## 2024-01-04 DIAGNOSIS — M25611 Stiffness of right shoulder, not elsewhere classified: Secondary | ICD-10-CM | POA: Diagnosis present

## 2024-01-04 DIAGNOSIS — G8929 Other chronic pain: Secondary | ICD-10-CM | POA: Insufficient documentation

## 2024-01-04 NOTE — Therapy (Signed)
 OUTPATIENT PHYSICAL THERAPY TREATMENT  Patient Name: Keshara Kiger MRN: [id number] DOB:1961/08/19, 63 y.o., female Today's Date: 01/04/2024  END OF SESSION:  PT End of Session - 01/04/24 0939     Visit Number 2    Number of Visits 25    Date for PT Re-Evaluation 03/23/24    PT Start Time 0938    PT Stop Time 1016    PT Time Calculation (min) 38 min    Activity Tolerance Patient limited by pain    Behavior During Therapy [facility] for tasks assessed/performed              History reviewed. No pertinent past medical history. Past Surgical History:  Procedure Laterality Date   TEE WITHOUT CARDIOVERSION N/A 05/29/2022   Procedure: TRANSESOPHAGEAL ECHOCARDIOGRAM (TEE);  Surgeon: Antonieta Iba, MD;  Location: [facility] ORS;  Service: Cardiovascular;  Laterality: N/A;   TUBAL LIGATION     Patient Active Problem List   Diagnosis Date Noted   Acute stroke due to ischemia (HCC) 05/27/2022   Obesity (BMI 30-39.9) 05/27/2022    PCP: Olin Pia, MD  REFERRING PROVIDER: Curtis Sites, Denny Peon, MD  REFERRING DIAG:  Diagnosis  S46.011A (ICD-10-CM) - Strain of tendon of right rotator cuff    Rationale for Evaluation and Treatment: Rehabilitation  THERAPY DIAG:  Chronic right shoulder pain  Muscle weakness (generalized)  Stiffness of right shoulder, not elsewhere classified  ONSET DATE: 05/27/2022   SUBJECTIVE:                                                                                                                                                                                           SUBJECTIVE STATEMENT: Today: Patient reports she was sorry for being a few min late- states traffic was reason. States right arm is sore with all active movement.   From EVAL=The pt is pleasant 63 y/o female referred to PT for strain of tendon of R RTC that developed following CVA 05/2022. Pt CVA affected R side and that she did not receive therapy for her RUE following stroke. R side is  dominant side. She cannot raise her R arm, can't lift anything due to pain. General movement hurts.  She now takes extra time to get dressed and often uses her LUE to completed ADLs. She is doing everything by herself still, but it takes longer. Pt reports she is now on disability. Pt reports worst pain is a 6/10. If she sleeps she thinks pain may get to a 0, but otherwise notices it at all times. She describes pain as a burning sensation, "like it is being ripped."  Pt unsure  of her fine motor control R hand since she is not using it much  PERTINENT HISTORY:    PMH per chart: CVA, HTN, preDM, sleep apnea, bipolar disorder, R hip pain, greater trochanteric pain syndrome or RLE, primary OA of R hip  PAIN:  Are you having pain?  RUE pain, worst pain is 6/10  PRECAUTIONS:  None  RED FLAGS: Does report bladder changes since stroke (increased frequency); PT advised pt to discuss with her physician, pt verbalized understanding    WEIGHT BEARING RESTRICTIONS:  No  FALLS:  Has patient fallen in last 6 months? No  LIVING ENVIRONMENT: Lives with: lives alone Lives in: House/apartment Stairs: 4 steps with handrails  Has following equipment at home: Dan Humphreys - 2 wheeled, reports not using any assistive devices   OCCUPATION:  On disability   PLOF:  Independent  PATIENT GOALS:  She'd like to be able to use her RUE, does not want to fully lose use of it.    OBJECTIVE:  Note: Objective measures were completed at Evaluation unless otherwise noted.  DIAGNOSTIC FINDINGS:  No pertinent imaging available in chart  PATIENT SURVEYS:  Quick Dash 61  COGNITIVE STATUS: Within functional limits for tasks assessed   SENSATION: Pt can feel a numbness/burning in her R arm Pt sensation intact to light touch BUE   Coordination: WFL rapid alt UE and chin to target  EDEMA:  Pt reports no swelling  POSTURE:  rounded shoulders, slight increase thoracic kyphosis   UE MMT: LUE: 4 to 4+/5 and  no pain RUE:  4-/5 * very pain limited flexion, abduction and IR; ER is only mm group not painful  Grip strength LUE 4/5 RUE 4+/5   UE AROM:  LUE: Flexion 160 deg Abduction 126 deg IR [facility]  ER needs formally assessment appears limited  RUE: Flexion 64 deg Abduction 62 deg IR [facility] ER needs formal assessment appears limited    HAND DOMINANCE:  Right                                                                                                                             TREATMENT DATE: 01/04/24  Manual therapy : Right shoulder distraction with oscillations Right shoulder Inf glide GH joint- Grade 11-07-28 bouts x 2 Right shoulder PA GH joint glide- grade 3 - 30 bouts x3  Therex:  Passive R shoulder ROM - Flex/abd/ER/IR-   Self care/home management:  Instructed patient in the following AAROM Shoulder flex (wand) 2 x 10 reps  Shoulder abd (wand) 2 x 10 reps Shoulder ER/IR (wand) 2 x 10 reps    PATIENT EDUCATION:  Education details: assessment findings, plan, prognosis, goals Person educated: Patient Education method: Explanation, Demonstration, Verbal cues, and Handouts Education comprehension: verbalized understanding and returned demonstration  HOME EXERCISE PROGRAM: Access Code: [email or web address] URL: [email or web address] Date: 12/30/2023 Prepared by: Temple Pacini  Exercises - Putty Squeezes  - 2 x daily - 5-7 x weekly - 2 sets -  1 reps - 60 sec  hold - Finger Lumbricals with Putty  - 2 x daily - 5-7 x weekly - 2 sets - 1 reps - 60 sec hold - Seated Scapular Retraction  - 1 x daily - 7 x weekly - 2 sets - 10 reps - Seated Shoulder Shrug Circles AROM Forward  - 1 x daily - 7 x weekly - 2 sets - 10 reps   ASSESSMENT:  CLINICAL IMPRESSION: Patient is a pleasant 63 y.o. female with chronic R shoulder pain Treatment today focused on ROM/pain relief and then instruction in some active assistive exercises for home program to assist with keeping shoulder  mobile. She responded well overall- pain limited with increased ROM yet abel to modify as needed. The patient will benefit from further skilled PT to improve deficits in order to decrease pain, increase strength, mobility, QOL and ADL ability.   OBJECTIVE IMPAIRMENTS: Abnormal gait, decreased activity tolerance, decreased mobility, decreased ROM, decreased strength, hypomobility, impaired flexibility, impaired sensation, impaired UE functional use, improper body mechanics, postural dysfunction, and pain.   ACTIVITY LIMITATIONS: carrying, lifting, bathing, dressing, reach over head, hygiene/grooming, and locomotion level  PARTICIPATION LIMITATIONS: meal prep, cleaning, shopping, community activity, occupation, and yard work  PERSONAL FACTORS: Age, Sex, Time since onset of injury/illness/exacerbation, and 3+ comorbidities: PMH per chart: CVA, HTN, preDM, sleep apnea, bipolar disorder, R hip pain, greater trochanteric pain syndrome or RLE, primary OA of R hip  are also affecting patient's functional outcome.   REHAB POTENTIAL: Good  CLINICAL DECISION MAKING: Evolving/moderate complexity  EVALUATION COMPLEXITY: Moderate   GOALS: Goals reviewed with patient? Yes   SHORT TERM GOALS: Target date: 02/10/2024    Patient will be independent in home exercise program to improve strength/mobility for better functional independence with ADLs. Baseline:initiated  Goal status: INITIAL   LONG TERM GOALS: Target date: 03/23/2024     Patient will decrease Quick DASH score by > 8 points demonstrating reduced self-reported upper extremity disability. Baseline: 61 Goal status: INITIAL  2.  Patient will improve R shoulder AROM to > 140 degrees of flexion, scaption, and abduction for improved ability to perform overhead activities. Baseline: flex/abd 64 deg/62 deg and pain limited Goal status: INITIAL  3.  Patient will reports a worst pain no greater than 4/10 in the past two weeks to indicate QOL  improvement. Baseline: 6/10 Goal status: INITIAL  4.  Patient will increase RUE gross strength to 4+/5 as to improve functional strength for increased ADL ability. Baseline: RUE strength grossly 4-/5 Goal status: INITIAL     PLAN:  PT FREQUENCY: 1-2x/week  PT DURATION: 12 weeks  PLANNED INTERVENTIONS: 97164- PT Re-evaluation, 97110-Therapeutic exercises, 97530- Therapeutic activity, 97112- Neuromuscular re-education, 97535- Self Care, 16109- Manual therapy, [phone]- Gait training, [phone]- Orthotic Fit/training, [phone]- Splinting, Patient/Family education, Balance training, Stair training, Taping, Dry Needling, Joint mobilization, Spinal mobilization, Scar mobilization, DME instructions, Cryotherapy, and Moist heat.  PLAN FOR NEXT SESSION: advance HEP to include gentle strengthening if tolerated, manual, PROM, isometrics   Lenda Kelp, PT 01/04/2024, 5:10 PM

## 2024-01-06 ENCOUNTER — Ambulatory Visit

## 2024-01-06 DIAGNOSIS — G8929 Other chronic pain: Secondary | ICD-10-CM

## 2024-01-06 DIAGNOSIS — M25611 Stiffness of right shoulder, not elsewhere classified: Secondary | ICD-10-CM

## 2024-01-06 DIAGNOSIS — M6281 Muscle weakness (generalized): Secondary | ICD-10-CM

## 2024-01-06 DIAGNOSIS — M25511 Pain in right shoulder: Secondary | ICD-10-CM | POA: Diagnosis not present

## 2024-01-06 NOTE — Therapy (Signed)
 OUTPATIENT PHYSICAL THERAPY TREATMENT  Patient Name: Mayson Sterbenz MRN: [id number] DOB:[birth year], 63 y.o., female Today's Date: 01/06/2024  END OF SESSION:  PT End of Session - 01/06/24 0818     Visit Number 3    Number of Visits 25    Date for PT Re-Evaluation 03/23/24    PT Start Time 0818    PT Stop Time 0851    PT Time Calculation (min) 33 min    Activity Tolerance Patient limited by pain    Behavior During Therapy [facility] for tasks assessed/performed              History reviewed. No pertinent past medical history. Past Surgical History:  Procedure Laterality Date   TEE WITHOUT CARDIOVERSION N/A 05/29/2022   Procedure: TRANSESOPHAGEAL ECHOCARDIOGRAM (TEE);  Surgeon: Antonieta Iba, MD;  Location: [facility] ORS;  Service: Cardiovascular;  Laterality: N/A;   TUBAL LIGATION     Patient Active Problem List   Diagnosis Date Noted   Acute stroke due to ischemia (HCC) 05/27/2022   Obesity (BMI 30-39.9) 05/27/2022    PCP: Curtis Sites, Denny Peon, MD  REFERRING PROVIDER: Curtis Sites, Denny Peon, MD  REFERRING DIAG:  Diagnosis  S46.011A (ICD-10-CM) - Strain of tendon of right rotator cuff    Rationale for Evaluation and Treatment: Rehabilitation  THERAPY DIAG:  Chronic right shoulder pain  Stiffness of right shoulder, not elsewhere classified  Muscle weakness (generalized)  ONSET DATE: 05/27/2022   SUBJECTIVE:                                                                                                                                                                                           SUBJECTIVE STATEMENT: Pt reports improvement in R hand symptoms following last visit. Has been performing her HEP.   PERTINENT HISTORY:  From EVAL=The pt is pleasant 63 y/o female referred to PT for strain of tendon of R RTC that developed following CVA 05/2022. Pt CVA affected R side and that she did not receive therapy for her RUE following stroke. R side is dominant side. She cannot  raise her R arm, can't lift anything due to pain. General movement hurts.  She now takes extra time to get dressed and often uses her LUE to completed ADLs. She is doing everything by herself still, but it takes longer. Pt reports she is now on disability. Pt reports worst pain is a 6/10. If she sleeps she thinks pain may get to a 0, but otherwise notices it at all times. She describes pain as a burning sensation, "like it is being ripped."  Pt unsure of her fine motor control R hand  since she is not using it much  PMH per chart: CVA, HTN, preDM, sleep apnea, bipolar disorder, R hip pain, greater trochanteric pain syndrome or RLE, primary OA of R hip  PAIN:  Are you having pain?  RUE pain, worst pain is 6/10  PRECAUTIONS:  None  RED FLAGS: Does report bladder changes since stroke (increased frequency); PT advised pt to discuss with her physician, pt verbalized understanding    WEIGHT BEARING RESTRICTIONS:  No  FALLS:  Has patient fallen in last 6 months? No  LIVING ENVIRONMENT: Lives with: lives alone Lives in: House/apartment Stairs: 4 steps with handrails  Has following equipment at home: Dan Humphreys - 2 wheeled, reports not using any assistive devices   OCCUPATION:  On disability   PLOF:  Independent  PATIENT GOALS:  She'd like to be able to use her RUE, does not want to fully lose use of it.    OBJECTIVE:  Note: Objective measures were completed at Evaluation unless otherwise noted.  DIAGNOSTIC FINDINGS:  No pertinent imaging available in chart  PATIENT SURVEYS:  Quick Dash 61  COGNITIVE STATUS: Within functional limits for tasks assessed   SENSATION: Pt can feel a numbness/burning in her R arm Pt sensation intact to light touch BUE   Coordination: WFL rapid alt UE and chin to target  EDEMA:  Pt reports no swelling  POSTURE:  rounded shoulders, slight increase thoracic kyphosis   UE MMT: LUE: 4 to 4+/5 and no pain RUE:  4-/5 * very pain limited flexion,  abduction and IR; ER is only mm group not painful  Grip strength LUE 4/5 RUE 4+/5   UE AROM:  LUE: Flexion 160 deg Abduction 126 deg IR [facility]  ER needs formally assessment appears limited  RUE: Flexion 64 deg Abduction 62 deg IR [facility] ER needs formal assessment appears limited    HAND DOMINANCE:  Right                                                                                                                             TREATMENT DATE: 01/06/24  Heat donned R shoulder and proximal RUE throughout session. Skin checked prior to and upon removal of heat and is in WNL with no adverse reaction. Pt reports heat feels good to RUE/Rs shoulder.  Manual therapy : Right shoulder distraction with oscillations x multiple reps Right shoulder Inf glide GH joint- Grade 11-07-28 bouts x 2 Right shoulder PA GH joint glide- grade 3 - 30 bouts x2 STM to R UT x 5 min  Therex:  multiple reps of the following using PVC pipe  Passive R shoulder ROM - Flex/abd/ER/IR-  *PT provides some additional hands-on assistance for flex and abduction to improve comfort. Pt currently only able to complete through a limited ROM comfortably.  Supine shoulder ext isometric into table (RUE) 10x 3 sec hold/rep  R manually resisted shoulder ER isometric very gentle 10x 3 sec hold/rep  PATIENT EDUCATION:  Education details: exercise technique Person educated: Patient Education method: Explanation, Demonstration, and Verbal cues Education comprehension: verbalized understanding and returned demonstration  HOME EXERCISE PROGRAM: Access Code: [email or web address] URL: [email or web address] Date: 12/30/2023 Prepared by: Temple Pacini  Exercises - Putty Squeezes  - 2 x daily - 5-7 x weekly - 2 sets - 1 reps - 60 sec  hold - Finger Lumbricals with Putty  - 2 x daily - 5-7 x weekly - 2 sets - 1 reps - 60 sec hold - Seated Scapular Retraction  - 1 x daily - 7 x weekly - 2 sets - 10 reps - Seated  Shoulder Shrug Circles AROM Forward  - 1 x daily - 7 x weekly - 2 sets - 10 reps   ASSESSMENT:  CLINICAL IMPRESSION: Continued focus on pain-relief, gentle ROM and introduction of supine shoulder isometrics. Pt responded well to use of heat while interventions were performed. ROM still very pain-limited with strength impairment. The patient will benefit from further skilled PT to improve deficits in order to decrease pain, increase strength, mobility, QOL and ADL ability.   OBJECTIVE IMPAIRMENTS: Abnormal gait, decreased activity tolerance, decreased mobility, decreased ROM, decreased strength, hypomobility, impaired flexibility, impaired sensation, impaired UE functional use, improper body mechanics, postural dysfunction, and pain.   ACTIVITY LIMITATIONS: carrying, lifting, bathing, dressing, reach over head, hygiene/grooming, and locomotion level  PARTICIPATION LIMITATIONS: meal prep, cleaning, shopping, community activity, occupation, and yard work  PERSONAL FACTORS: Age, Sex, Time since onset of injury/illness/exacerbation, and 3+ comorbidities: PMH per chart: CVA, HTN, preDM, sleep apnea, bipolar disorder, R hip pain, greater trochanteric pain syndrome or RLE, primary OA of R hip  are also affecting patient's functional outcome.   REHAB POTENTIAL: Good  CLINICAL DECISION MAKING: Evolving/moderate complexity  EVALUATION COMPLEXITY: Moderate   GOALS: Goals reviewed with patient? Yes   SHORT TERM GOALS: Target date: 02/10/2024    Patient will be independent in home exercise program to improve strength/mobility for better functional independence with ADLs. Baseline:initiated  Goal status: INITIAL   LONG TERM GOALS: Target date: 03/23/2024     Patient will decrease Quick DASH score by > 8 points demonstrating reduced self-reported upper extremity disability. Baseline: 61 Goal status: INITIAL  2.  Patient will improve R shoulder AROM to > 140 degrees of flexion, scaption, and  abduction for improved ability to perform overhead activities. Baseline: flex/abd 64 deg/62 deg and pain limited Goal status: INITIAL  3.  Patient will reports a worst pain no greater than 4/10 in the past two weeks to indicate QOL improvement. Baseline: 6/10 Goal status: INITIAL  4.  Patient will increase RUE gross strength to 4+/5 as to improve functional strength for increased ADL ability. Baseline: RUE strength grossly 4-/5 Goal status: INITIAL     PLAN:  PT FREQUENCY: 1-2x/week  PT DURATION: 12 weeks  PLANNED INTERVENTIONS: 97164- PT Re-evaluation, 97110-Therapeutic exercises, 97530- Therapeutic activity, 97112- Neuromuscular re-education, 97535- Self Care, [id number]- Manual therapy, [phone]- Gait training, [phone]- Orthotic Fit/training, [phone]- Splinting, Patient/Family education, Balance training, Stair training, Taping, Dry Needling, Joint mobilization, Spinal mobilization, Scar mobilization, DME instructions, Cryotherapy, and Moist heat.  PLAN FOR NEXT SESSION: advance HEP to include gentle strengthening if tolerated, manual, PROM, isometrics   Baird Kay, PT 01/06/2024, 8:58 AM

## 2024-01-11 ENCOUNTER — Ambulatory Visit

## 2024-01-11 DIAGNOSIS — G8929 Other chronic pain: Secondary | ICD-10-CM

## 2024-01-11 DIAGNOSIS — M25611 Stiffness of right shoulder, not elsewhere classified: Secondary | ICD-10-CM

## 2024-01-11 DIAGNOSIS — M25511 Pain in right shoulder: Secondary | ICD-10-CM | POA: Diagnosis not present

## 2024-01-11 DIAGNOSIS — M6281 Muscle weakness (generalized): Secondary | ICD-10-CM

## 2024-01-11 NOTE — Therapy (Signed)
 OUTPATIENT PHYSICAL THERAPY TREATMENT  Patient Name: Jacqueline Steele MRN: [id number] DOB:08/17/1961, 63 y.o., female Today's Date: 01/11/2024  END OF SESSION:  PT End of Session - 01/11/24 1258     Visit Number 4    Number of Visits 25    Date for PT Re-Evaluation 03/23/24    PT Start Time 0813    PT Stop Time 0844    PT Time Calculation (min) 31 min    Activity Tolerance Patient limited by pain    Behavior During Therapy [facility] for tasks assessed/performed               History reviewed. No pertinent past medical history. Past Surgical History:  Procedure Laterality Date   TEE WITHOUT CARDIOVERSION N/A 05/29/2022   Procedure: TRANSESOPHAGEAL ECHOCARDIOGRAM (TEE);  Surgeon: Antonieta Iba, MD;  Location: [facility] ORS;  Service: Cardiovascular;  Laterality: N/A;   TUBAL LIGATION     Patient Active Problem List   Diagnosis Date Noted   Acute stroke due to ischemia (HCC) 05/27/2022   Obesity (BMI 30-39.9) 05/27/2022    PCP: Curtis Sites, Denny Peon, MD  REFERRING PROVIDER: Curtis Sites, Denny Peon, MD  REFERRING DIAG:  Diagnosis  S46.011A (ICD-10-CM) - Strain of tendon of right rotator cuff    Rationale for Evaluation and Treatment: Rehabilitation  THERAPY DIAG:  Chronic right shoulder pain  Stiffness of right shoulder, not elsewhere classified  Muscle weakness (generalized)  ONSET DATE: 05/27/2022   SUBJECTIVE:                                                                                                                                                                                           SUBJECTIVE STATEMENT: Pt doing ok this morning. Consistent with  HEP.   PERTINENT HISTORY:  From EVAL=The pt is pleasant 63 y/o female referred to PT for strain of tendon of R RTC that developed following CVA 05/2022. Pt CVA affected R side and that she did not receive therapy for her RUE following stroke. R side is dominant side. She cannot raise her R arm, can't lift anything due to  pain. General movement hurts.  She now takes extra time to get dressed and often uses her LUE to completed ADLs. She is doing everything by herself still, but it takes longer. Pt reports she is now on disability. Pt reports worst pain is a 6/10. If she sleeps she thinks pain may get to a 0, but otherwise notices it at all times. She describes pain as a burning sensation, "like it is being ripped."  Pt unsure of her fine motor control R hand since she is not using  it much  PMH per chart: CVA, HTN, preDM, sleep apnea, bipolar disorder, R hip pain, greater trochanteric pain syndrome or RLE, primary OA of R hip  PAIN:  Are you having pain?  RUE pain, worst pain is 6/10  PRECAUTIONS:  None  RED FLAGS: Does report bladder changes since stroke (increased frequency); PT advised pt to discuss with her physician, pt verbalized understanding    WEIGHT BEARING RESTRICTIONS:  No  FALLS:  Has patient fallen in last 6 months? No  LIVING ENVIRONMENT: Lives with: lives alone Lives in: House/apartment Stairs: 4 steps with handrails  Has following equipment at home: Dan Humphreys - 2 wheeled, reports not using any assistive devices   OCCUPATION:  On disability   PLOF:  Independent  PATIENT GOALS:  She'd like to be able to use her RUE, does not want to fully lose use of it.    OBJECTIVE:  Note: Objective measures were completed at Evaluation unless otherwise noted.  DIAGNOSTIC FINDINGS:  No pertinent imaging available in chart  PATIENT SURVEYS:  Quick Dash 61  COGNITIVE STATUS: Within functional limits for tasks assessed   SENSATION: Pt can feel a numbness/burning in her R arm Pt sensation intact to light touch BUE   Coordination: WFL rapid alt UE and chin to target  EDEMA:  Pt reports no swelling  POSTURE:  rounded shoulders, slight increase thoracic kyphosis   UE MMT: LUE: 4 to 4+/5 and no pain RUE:  4-/5 * very pain limited flexion, abduction and IR; ER is only mm group not  painful  Grip strength LUE 4/5 RUE 4+/5   UE AROM:  LUE: Flexion 160 deg Abduction 126 deg IR [facility]  ER needs formally assessment appears limited  RUE: Flexion 64 deg Abduction 62 deg IR [facility] ER needs formal assessment appears limited    HAND DOMINANCE:  Right                                                                                                                             TREATMENT DATE: 01/11/24   Manual therapy: hooklye on plinth, bolster supporting BLE  Right shoulder Inf glide GH joint- Grade 11-07-28 bouts x 4  STM to R UT and R posterior shoudler mm, R anterior deltoid x several minutes  TE: Supine R shoulder ext isometric into table (RUE) 2x10 3 sec hold/rep   R manually resisted shoulder ER isometric, gentle activation, 2x10 3 sec hold/rep - not yet easy   Supine PVC chest press 1x10,1x6  - improved  Supine PVC scaption 10x - limited range  PROM R shoulder ER 3x, scaption 3x; flexion and abduction x multple reps within pain-free/limited ranges     PATIENT EDUCATION:  Education details: exercise technique Person educated: Patient Education method: Explanation, Demonstration, and Verbal cues Education comprehension: verbalized understanding and returned demonstration  HOME EXERCISE PROGRAM: Access Code: [email or web address] URL: [email or web address] Date: 12/30/2023 Prepared by: Temple Pacini  Exercises - Putty Squeezes  -  2 x daily - 5-7 x weekly - 2 sets - 1 reps - 60 sec  hold - Finger Lumbricals with Putty  - 2 x daily - 5-7 x weekly - 2 sets - 1 reps - 60 sec hold - Seated Scapular Retraction  - 1 x daily - 7 x weekly - 2 sets - 10 reps - Seated Shoulder Shrug Circles AROM Forward  - 1 x daily - 7 x weekly - 2 sets - 10 reps   ASSESSMENT:  CLINICAL IMPRESSION: Pt shows improvement with PVC chest press RUE ROM today. Pt still very pain-limited with flex/abd movements. The patient will benefit from further skilled PT to improve  deficits in order to decrease pain, increase strength, mobility, QOL and ADL ability.   OBJECTIVE IMPAIRMENTS: Abnormal gait, decreased activity tolerance, decreased mobility, decreased ROM, decreased strength, hypomobility, impaired flexibility, impaired sensation, impaired UE functional use, improper body mechanics, postural dysfunction, and pain.   ACTIVITY LIMITATIONS: carrying, lifting, bathing, dressing, reach over head, hygiene/grooming, and locomotion level  PARTICIPATION LIMITATIONS: meal prep, cleaning, shopping, community activity, occupation, and yard work  PERSONAL FACTORS: Age, Sex, Time since onset of injury/illness/exacerbation, and 3+ comorbidities: PMH per chart: CVA, HTN, preDM, sleep apnea, bipolar disorder, R hip pain, greater trochanteric pain syndrome or RLE, primary OA of R hip  are also affecting patient's functional outcome.   REHAB POTENTIAL: Good  CLINICAL DECISION MAKING: Evolving/moderate complexity  EVALUATION COMPLEXITY: Moderate   GOALS: Goals reviewed with patient? Yes   SHORT TERM GOALS: Target date: 02/10/2024    Patient will be independent in home exercise program to improve strength/mobility for better functional independence with ADLs. Baseline:initiated  Goal status: INITIAL   LONG TERM GOALS: Target date: 03/23/2024     Patient will decrease Quick DASH score by > 8 points demonstrating reduced self-reported upper extremity disability. Baseline: 61 Goal status: INITIAL  2.  Patient will improve R shoulder AROM to > 140 degrees of flexion, scaption, and abduction for improved ability to perform overhead activities. Baseline: flex/abd 64 deg/62 deg and pain limited Goal status: INITIAL  3.  Patient will reports a worst pain no greater than 4/10 in the past two weeks to indicate QOL improvement. Baseline: 6/10 Goal status: INITIAL  4.  Patient will increase RUE gross strength to 4+/5 as to improve functional strength for increased ADL  ability. Baseline: RUE strength grossly 4-/5 Goal status: INITIAL     PLAN:  PT FREQUENCY: 1-2x/week  PT DURATION: 12 weeks  PLANNED INTERVENTIONS: 97164- PT Re-evaluation, 97110-Therapeutic exercises, 97530- Therapeutic activity, 97112- Neuromuscular re-education, 97535- Self Care, 16109- Manual therapy, [phone]- Gait training, [phone]- Orthotic Fit/training, [phone]- Splinting, Patient/Family education, Balance training, Stair training, Taping, Dry Needling, Joint mobilization, Spinal mobilization, Scar mobilization, DME instructions, Cryotherapy, and Moist heat.  PLAN FOR NEXT SESSION: advance HEP to include gentle strengthening if tolerated, manual, PROM, isometrics   Baird Kay, PT 01/11/2024, 1:02 PM

## 2024-01-12 NOTE — Therapy (Signed)
 OUTPATIENT PHYSICAL THERAPY TREATMENT  Patient Name: Jacqueline Steele MRN: [id number] DOB:25-Mar-1961, 63 y.o., female Today's Date: 01/12/2024  END OF SESSION:      No past medical history on file. Past Surgical History:  Procedure Laterality Date   TEE WITHOUT CARDIOVERSION N/A 05/29/2022   Procedure: TRANSESOPHAGEAL ECHOCARDIOGRAM (TEE);  Surgeon: Antonieta Iba, MD;  Location: [facility] ORS;  Service: Cardiovascular;  Laterality: N/A;   TUBAL LIGATION     Patient Active Problem List   Diagnosis Date Noted   Acute stroke due to ischemia (HCC) 05/27/2022   Obesity (BMI 30-39.9) 05/27/2022    PCP: Olin Pia, MD  REFERRING PROVIDER: Curtis Sites, Denny Peon, MD  REFERRING DIAG:  Diagnosis  S46.011A (ICD-10-CM) - Strain of tendon of right rotator cuff    Rationale for Evaluation and Treatment: Rehabilitation  THERAPY DIAG:  No diagnosis found.  ONSET DATE: 05/27/2022   SUBJECTIVE:                                                                                                                                                                                           SUBJECTIVE STATEMENT: ***   PERTINENT HISTORY:  From EVAL=The pt is pleasant 63 y/o female referred to PT for strain of tendon of R RTC that developed following CVA 05/2022. Pt CVA affected R side and that she did not receive therapy for her RUE following stroke. R side is dominant side. She cannot raise her R arm, can't lift anything due to pain. General movement hurts.  She now takes extra time to get dressed and often uses her LUE to completed ADLs. She is doing everything by herself still, but it takes longer. Pt reports she is now on disability. Pt reports worst pain is a 6/10. If she sleeps she thinks pain may get to a 0, but otherwise notices it at all times. She describes pain as a burning sensation, "like it is being ripped."  Pt unsure of her fine motor control R hand since she is not using it much  PMH per  chart: CVA, HTN, preDM, sleep apnea, bipolar disorder, R hip pain, greater trochanteric pain syndrome or RLE, primary OA of R hip  PAIN:  Are you having pain?  RUE pain, worst pain is 6/10  PRECAUTIONS:  None  RED FLAGS: Does report bladder changes since stroke (increased frequency); PT advised pt to discuss with her physician, pt verbalized understanding    WEIGHT BEARING RESTRICTIONS:  No  FALLS:  Has patient fallen in last 6 months? No  LIVING ENVIRONMENT: Lives with: lives alone Lives in: House/apartment Stairs: 4 steps with handrails  Has  following equipment at home: Dan Humphreys - 2 wheeled, reports not using any assistive devices   OCCUPATION:  On disability   PLOF:  Independent  PATIENT GOALS:  She'd like to be able to use her RUE, does not want to fully lose use of it.    OBJECTIVE:  Note: Objective measures were completed at Evaluation unless otherwise noted.  DIAGNOSTIC FINDINGS:  No pertinent imaging available in chart  PATIENT SURVEYS:  Quick Dash 61  COGNITIVE STATUS: Within functional limits for tasks assessed   SENSATION: Pt can feel a numbness/burning in her R arm Pt sensation intact to light touch BUE   Coordination: WFL rapid alt UE and chin to target  EDEMA:  Pt reports no swelling  POSTURE:  rounded shoulders, slight increase thoracic kyphosis   UE MMT: LUE: 4 to 4+/5 and no pain RUE:  4-/5 * very pain limited flexion, abduction and IR; ER is only mm group not painful  Grip strength LUE 4/5 RUE 4+/5   UE AROM:  LUE: Flexion 160 deg Abduction 126 deg IR [facility]  ER needs formally assessment appears limited  RUE: Flexion 64 deg Abduction 62 deg IR [facility] ER needs formal assessment appears limited    HAND DOMINANCE:  Right                                                                                                                             TREATMENT DATE: 01/12/24   Manual therapy: hooklye on plinth, bolster  supporting BLE  Right shoulder Inf glide GH joint- Grade 11-07-28 bouts x 4  STM to R UT and R posterior shoulder mm, R anterior deltoid x several minutes  TE: Supine R shoulder ext isometric into table (RUE) 2x10 3 sec hold/rep   R manually resisted shoulder ER isometric, gentle activation, 2x10 3 sec hold/rep - not yet easy   Supine PVC chest press 1x10,1x6  - improved  Supine PVC scaption 10x - limited range  PROM R shoulder ER 3x, scaption 3x; flexion and abduction x multple reps within pain-free/limited ranges     PATIENT EDUCATION:  Education details: exercise technique Person educated: Patient Education method: Explanation, Demonstration, and Verbal cues Education comprehension: verbalized understanding and returned demonstration  HOME EXERCISE PROGRAM: Access Code: [email or web address] URL: [email or web address] Date: 12/30/2023 Prepared by: Temple Pacini  Exercises - Putty Squeezes  - 2 x daily - 5-7 x weekly - 2 sets - 1 reps - 60 sec  hold - Finger Lumbricals with Putty  - 2 x daily - 5-7 x weekly - 2 sets - 1 reps - 60 sec hold - Seated Scapular Retraction  - 1 x daily - 7 x weekly - 2 sets - 10 reps - Seated Shoulder Shrug Circles AROM Forward  - 1 x daily - 7 x weekly - 2 sets - 10 reps   ASSESSMENT:  CLINICAL IMPRESSION: ***. The patient will benefit from further skilled PT to improve deficits in order  to decrease pain, increase strength, mobility, QOL and ADL ability.   OBJECTIVE IMPAIRMENTS: Abnormal gait, decreased activity tolerance, decreased mobility, decreased ROM, decreased strength, hypomobility, impaired flexibility, impaired sensation, impaired UE functional use, improper body mechanics, postural dysfunction, and pain.   ACTIVITY LIMITATIONS: carrying, lifting, bathing, dressing, reach over head, hygiene/grooming, and locomotion level  PARTICIPATION LIMITATIONS: meal prep, cleaning, shopping, community activity, occupation, and yard  work  PERSONAL FACTORS: Age, Sex, Time since onset of injury/illness/exacerbation, and 3+ comorbidities: PMH per chart: CVA, HTN, preDM, sleep apnea, bipolar disorder, R hip pain, greater trochanteric pain syndrome or RLE, primary OA of R hip  are also affecting patient's functional outcome.   REHAB POTENTIAL: Good  CLINICAL DECISION MAKING: Evolving/moderate complexity  EVALUATION COMPLEXITY: Moderate   GOALS: Goals reviewed with patient? Yes   SHORT TERM GOALS: Target date: 02/10/2024    Patient will be independent in home exercise program to improve strength/mobility for better functional independence with ADLs. Baseline:initiated  Goal status: INITIAL   LONG TERM GOALS: Target date: 03/23/2024     Patient will decrease Quick DASH score by > 8 points demonstrating reduced self-reported upper extremity disability. Baseline: 61 Goal status: INITIAL  2.  Patient will improve R shoulder AROM to > 140 degrees of flexion, scaption, and abduction for improved ability to perform overhead activities. Baseline: flex/abd 64 deg/62 deg and pain limited Goal status: INITIAL  3.  Patient will reports a worst pain no greater than 4/10 in the past two weeks to indicate QOL improvement. Baseline: 6/10 Goal status: INITIAL  4.  Patient will increase RUE gross strength to 4+/5 as to improve functional strength for increased ADL ability. Baseline: RUE strength grossly 4-/5 Goal status: INITIAL     PLAN:  PT FREQUENCY: 1-2x/week  PT DURATION: 12 weeks  PLANNED INTERVENTIONS: 97164- PT Re-evaluation, 97110-Therapeutic exercises, 97530- Therapeutic activity, 97112- Neuromuscular re-education, 97535- Self Care, 81191- Manual therapy, [phone]- Gait training, [phone]- Orthotic Fit/training, [phone]- Splinting, Patient/Family education, Balance training, Stair training, Taping, Dry Needling, Joint mobilization, Spinal mobilization, Scar mobilization, DME instructions, Cryotherapy, and Moist  heat.  PLAN FOR NEXT SESSION: advance HEP to include gentle strengthening if tolerated, manual, PROM, isometrics   Precious Bard, PT 01/12/2024, 5:12 PM

## 2024-01-13 ENCOUNTER — Ambulatory Visit

## 2024-01-13 DIAGNOSIS — M25611 Stiffness of right shoulder, not elsewhere classified: Secondary | ICD-10-CM

## 2024-01-13 DIAGNOSIS — M6281 Muscle weakness (generalized): Secondary | ICD-10-CM

## 2024-01-13 DIAGNOSIS — G8929 Other chronic pain: Secondary | ICD-10-CM

## 2024-01-13 DIAGNOSIS — M25511 Pain in right shoulder: Secondary | ICD-10-CM | POA: Diagnosis not present

## 2024-01-18 ENCOUNTER — Ambulatory Visit: Admitting: Physical Therapy

## 2024-01-18 DIAGNOSIS — G8929 Other chronic pain: Secondary | ICD-10-CM

## 2024-01-18 DIAGNOSIS — M25511 Pain in right shoulder: Secondary | ICD-10-CM | POA: Diagnosis not present

## 2024-01-18 DIAGNOSIS — M6281 Muscle weakness (generalized): Secondary | ICD-10-CM

## 2024-01-18 DIAGNOSIS — M25611 Stiffness of right shoulder, not elsewhere classified: Secondary | ICD-10-CM

## 2024-01-18 NOTE — Therapy (Signed)
 OUTPATIENT PHYSICAL THERAPY TREATMENT  Patient Name: Jacqueline Steele MRN: [id number] DOB:29-Nov-1960, 63 y.o., female Today's Date: 01/18/2024  END OF SESSION:  PT End of Session - 01/18/24 0849     Visit Number 6    Number of Visits 25    Date for PT Re-Evaluation 03/23/24    PT Start Time 0850    PT Stop Time 0930    PT Time Calculation (min) 40 min    Activity Tolerance Patient limited by pain    Behavior During Therapy [facility] for tasks assessed/performed                No past medical history on file. Past Surgical History:  Procedure Laterality Date   TEE WITHOUT CARDIOVERSION N/A 05/29/2022   Procedure: TRANSESOPHAGEAL ECHOCARDIOGRAM (TEE);  Surgeon: Devorah Fonder, MD;  Location: [facility] ORS;  Service: Cardiovascular;  Laterality: N/A;   TUBAL LIGATION     Patient Active Problem List   Diagnosis Date Noted   Acute stroke due to ischemia (HCC) 05/27/2022   Obesity (BMI 30-39.9) 05/27/2022    PCP: Osie Bleacher, [place] Dales, MD  REFERRING PROVIDER: Osie Bleacher, Cleveland Dales, MD  REFERRING DIAG:  Diagnosis  S46.011A (ICD-10-CM) - Strain of tendon of right rotator cuff    Rationale for Evaluation and Treatment: Rehabilitation  THERAPY DIAG:  Chronic right shoulder pain  Stiffness of right shoulder, not elsewhere classified  Muscle weakness (generalized)  ONSET DATE: 05/27/2022   SUBJECTIVE:                                                                                                                                                                                           SUBJECTIVE STATEMENT:  Pt reports that she had an okay weekend. Saw grandson over the weekend, but did not do much other than that. Reports no pain at start of PT session. Was able to sleep better last night.    Patient reports 2/10 pain today, had severe pain after last session.    PERTINENT HISTORY:  From EVAL=The pt is pleasant 63 y/o female referred to PT for strain of tendon of R RTC that  developed following CVA 05/2022. Pt CVA affected R side and that she did not receive therapy for her RUE following stroke. R side is dominant side. She cannot raise her R arm, can't lift anything due to pain. General movement hurts.  She now takes extra time to get dressed and often uses her LUE to completed ADLs. She is doing everything by herself still, but it takes longer. Pt reports she is now on disability. Pt reports worst pain is a 6/10. If she  sleeps she thinks pain may get to a 0, but otherwise notices it at all times. She describes pain as a burning sensation, "like it is being ripped."  Pt unsure of her fine motor control R hand since she is not using it much  PMH per chart: CVA, HTN, preDM, sleep apnea, bipolar disorder, R hip pain, greater trochanteric pain syndrome or RLE, primary OA of R hip  PAIN:  Are you having pain?  RUE pain, worst pain is 6/10  PRECAUTIONS:  None  RED FLAGS: Does report bladder changes since stroke (increased frequency); PT advised pt to discuss with her physician, pt verbalized understanding    WEIGHT BEARING RESTRICTIONS:  No  FALLS:  Has patient fallen in last 6 months? No  LIVING ENVIRONMENT: Lives with: lives alone Lives in: House/apartment Stairs: 4 steps with handrails  Has following equipment at home: Otho Blitz - 2 wheeled, reports not using any assistive devices   OCCUPATION:  On disability   PLOF:  Independent  PATIENT GOALS:  She'd like to be able to use her RUE, does not want to fully lose use of it.    OBJECTIVE:  Note: Objective measures were completed at Evaluation unless otherwise noted.  DIAGNOSTIC FINDINGS:  No pertinent imaging available in chart  PATIENT SURVEYS:  Quick Dash 61  COGNITIVE STATUS: Within functional limits for tasks assessed   SENSATION: Pt can feel a numbness/burning in her R arm Pt sensation intact to light touch BUE   Coordination: WFL rapid alt UE and chin to target  EDEMA:  Pt reports no  swelling  POSTURE:  rounded shoulders, slight increase thoracic kyphosis   UE MMT: LUE: 4 to 4+/5 and no pain RUE:  4-/5 * very pain limited flexion, abduction and IR; ER is only mm group not painful  Grip strength LUE 4/5 RUE 4+/5   UE AROM:  LUE: Flexion 160 deg Abduction 126 deg IR [facility]  ER needs formally assessment appears limited  RUE: Flexion 64 deg Abduction 62 deg IR [facility] ER needs formal assessment appears limited    HAND DOMINANCE:  Right                                                                                                                             TREATMENT DATE: 01/18/24   Manual therapy: hooklye on plinth, bolster supporting BLE  Right shoulder Inf glide GH joint- Grade 2-3; 30sec  bouts x 4 STM to R UT and R posterior shoulder mm, R anterior deltoid x several minutes  TE: Supine R shoulder ext isometric into table (RUE) 2x10 3 sec hold/rep   R AROM shoulder ER, gentle activation in pain free range, 2x10 3 sec hold/rep - not yet easy   Supine PVC chest press 1x10,1x6  - improved  Supine PVC scaption 10x - limited range pain with eccentric control  PROM R shoulder ER x, scaption x; flexion and abduction x multple reps within pain-free/limited ranges  Pt reports increase pain In shoulder following supine STM and therex. Heat applied to Bil shoulders for pain management.   Seated:  STM to distal Latissimus/teres major x 2 min   Therex:  Scapular retraction 2 x 8 UE slide AAROM across mat table flexion x 10 abduction x 10; 2 sec hold at end range.   Pt rates increased shoulder pain to 4/10 upon completion of PT treatment.   PATIENT EDUCATION:  Education details: exercise technique Person educated: Patient Education method: Explanation, Demonstration, and Verbal cues Education comprehension: verbalized understanding and returned demonstration  HOME EXERCISE PROGRAM: Access Code: [email or web address] URL:  [email or web address] Date: 01/18/2024 Prepared by: Grier Rocher  Exercises - Putty Squeezes  - 2 x daily - 5-7 x weekly - 2 sets - 1 reps - 60 sec  hold - Finger Lumbricals with Putty  - 2 x daily - 5-7 x weekly - 2 sets - 1 reps - 60 sec hold - Seated Scapular Retraction  - 1 x daily - 7 x weekly - 2 sets - 10 reps - Seated Shoulder Shrug Circles AROM Forward  - 1 x daily - 7 x weekly - 2 sets - 10 reps - Isometric Shoulder Extension at Wall  - 1 x daily - 7 x weekly - 3 sets - 10 reps - Standing Isometric Shoulder Flexion with Doorway - Arm Bent  - 1 x daily - 7 x weekly - 3 sets - 10 reps   ASSESSMENT:  CLINICAL IMPRESSION: She is highly motivated for functional activity. States that she was able to sleep better last night, and overall has had less sholder pain since starting PT. Pain increase with activity reported at end of PT treatment. Added isometric shoulder activation to HEP, with good understanding from Pt for  The patient will benefit from further skilled PT to improve deficits in order to decrease pain, increase strength, mobility, QOL and ADL ability.   OBJECTIVE IMPAIRMENTS: Abnormal gait, decreased activity tolerance, decreased mobility, decreased ROM, decreased strength, hypomobility, impaired flexibility, impaired sensation, impaired UE functional use, improper body mechanics, postural dysfunction, and pain.   ACTIVITY LIMITATIONS: carrying, lifting, bathing, dressing, reach over head, hygiene/grooming, and locomotion level  PARTICIPATION LIMITATIONS: meal prep, cleaning, shopping, community activity, occupation, and yard work  PERSONAL FACTORS: Age, Sex, Time since onset of injury/illness/exacerbation, and 3+ comorbidities: PMH per chart: CVA, HTN, preDM, sleep apnea, bipolar disorder, R hip pain, greater trochanteric pain syndrome or RLE, primary OA of R hip  are also affecting patient's functional outcome.   REHAB POTENTIAL: Good  CLINICAL DECISION  MAKING: Evolving/moderate complexity  EVALUATION COMPLEXITY: Moderate   GOALS: Goals reviewed with patient? Yes   SHORT TERM GOALS: Target date: 02/10/2024    Patient will be independent in home exercise program to improve strength/mobility for better functional independence with ADLs. Baseline:initiated  Goal status: INITIAL   LONG TERM GOALS: Target date: 03/23/2024     Patient will decrease Quick DASH score by > 8 points demonstrating reduced self-reported upper extremity disability. Baseline: 61 Goal status: INITIAL  2.  Patient will improve R shoulder AROM to > 140 degrees of flexion, scaption, and abduction for improved ability to perform overhead activities. Baseline: flex/abd 64 deg/62 deg and pain limited Goal status: INITIAL  3.  Patient will reports a worst pain no greater than 4/10 in the past two weeks to indicate QOL improvement. Baseline: 6/10 Goal status: INITIAL  4.  Patient will increase RUE gross strength to 4+/5 as to improve functional  strength for increased ADL ability. Baseline: RUE strength grossly 4-/5 Goal status: INITIAL     PLAN:  PT FREQUENCY: 1-2x/week  PT DURATION: 12 weeks  PLANNED INTERVENTIONS: 97164- PT Re-evaluation, 97110-Therapeutic exercises, 97530- Therapeutic activity, 97112- Neuromuscular re-education, 97535- Self Care, 36644- Manual therapy, [phone]- Gait training, [phone]- Orthotic Fit/training, [phone]- Splinting, Patient/Family education, Balance training, Stair training, Taping, Dry Needling, Joint mobilization, Spinal mobilization, Scar mobilization, DME instructions, Cryotherapy, and Moist heat.  PLAN FOR NEXT SESSION:   Review HEP, manual, PROM, isometrics. AROM as tolerated.    Barbara Book, PT 01/18/2024, 8:50 AM

## 2024-01-20 ENCOUNTER — Ambulatory Visit: Admitting: Physical Therapy

## 2024-01-20 DIAGNOSIS — M25611 Stiffness of right shoulder, not elsewhere classified: Secondary | ICD-10-CM

## 2024-01-20 DIAGNOSIS — M25511 Pain in right shoulder: Secondary | ICD-10-CM | POA: Diagnosis not present

## 2024-01-20 DIAGNOSIS — G8929 Other chronic pain: Secondary | ICD-10-CM

## 2024-01-20 DIAGNOSIS — M6281 Muscle weakness (generalized): Secondary | ICD-10-CM

## 2024-01-20 NOTE — Therapy (Signed)
 OUTPATIENT PHYSICAL THERAPY TREATMENT  Patient Name: Jacqueline Steele MRN: [id number] DOB:1960/11/30, 63 y.o., female Today's Date: 01/20/2024  END OF SESSION:  PT End of Session - 01/20/24 1003     Visit Number 7    Number of Visits 25    Date for PT Re-Evaluation 03/23/24    PT Start Time 1018    PT Stop Time 1100    PT Time Calculation (min) 42 min    Activity Tolerance Patient limited by pain    Behavior During Therapy [facility] for tasks assessed/performed                No past medical history on file. Past Surgical History:  Procedure Laterality Date   TEE WITHOUT CARDIOVERSION N/A 05/29/2022   Procedure: TRANSESOPHAGEAL ECHOCARDIOGRAM (TEE);  Surgeon: Devorah Fonder, MD;  Location: [facility] ORS;  Service: Cardiovascular;  Laterality: N/A;   TUBAL LIGATION     Patient Active Problem List   Diagnosis Date Noted   Acute stroke due to ischemia (HCC) 05/27/2022   Obesity (BMI 30-39.9) 05/27/2022    PCP: Osie Bleacher, [place] Dales, MD  REFERRING PROVIDER: Osie Bleacher, [place] Dales, MD  REFERRING DIAG:  Diagnosis  S46.011A (ICD-10-CM) - Strain of tendon of right rotator cuff    Rationale for Evaluation and Treatment: Rehabilitation  THERAPY DIAG:  Chronic right shoulder pain  Stiffness of right shoulder, not elsewhere classified  Muscle weakness (generalized)  ONSET DATE: 05/27/2022   SUBJECTIVE:                                                                                                                                                                                           SUBJECTIVE STATEMENT:   Pt reports that she is doing well. No significant pain this AM. Reports that "That girl gave me a great massage last session, and used the heat to help it relax. Now my shoulder is feeling much better" (pt was seen by Author on last PT session)  Patient reports 2/10 pain today, had severe pain after last session.    PERTINENT HISTORY:  From EVAL=The pt is pleasant 63 y/o  female referred to PT for strain of tendon of R RTC that developed following CVA 05/2022. Pt CVA affected R side and that she did not receive therapy for her RUE following stroke. R side is dominant side. She cannot raise her R arm, can't lift anything due to pain. General movement hurts.  She now takes extra time to get dressed and often uses her LUE to completed ADLs. She is doing everything by herself still, but it takes longer. Pt reports she is now on  disability. Pt reports worst pain is a 6/10. If she sleeps she thinks pain may get to a 0, but otherwise notices it at all times. She describes pain as a burning sensation, "like it is being ripped."  Pt unsure of her fine motor control R hand since she is not using it much  PMH per chart: CVA, HTN, preDM, sleep apnea, bipolar disorder, R hip pain, greater trochanteric pain syndrome or RLE, primary OA of R hip  PAIN:  Are you having pain?  RUE pain, worst pain is 6/10  PRECAUTIONS:  None  RED FLAGS: Does report bladder changes since stroke (increased frequency); PT advised pt to discuss with her physician, pt verbalized understanding    WEIGHT BEARING RESTRICTIONS:  No  FALLS:  Has patient fallen in last 6 months? No  LIVING ENVIRONMENT: Lives with: lives alone Lives in: House/apartment Stairs: 4 steps with handrails  Has following equipment at home: Otho Blitz - 2 wheeled, reports not using any assistive devices   OCCUPATION:  On disability   PLOF:  Independent  PATIENT GOALS:  She'd like to be able to use her RUE, does not want to fully lose use of it.    OBJECTIVE:  Note: Objective measures were completed at Evaluation unless otherwise noted.  DIAGNOSTIC FINDINGS:  No pertinent imaging available in chart  PATIENT SURVEYS:  Quick Dash 61  COGNITIVE STATUS: Within functional limits for tasks assessed   SENSATION: Pt can feel a numbness/burning in her R arm Pt sensation intact to light touch BUE   Coordination: WFL  rapid alt UE and chin to target  EDEMA:  Pt reports no swelling  POSTURE:  rounded shoulders, slight increase thoracic kyphosis   UE MMT: LUE: 4 to 4+/5 and no pain RUE:  4-/5 * very pain limited flexion, abduction and IR; ER is only mm group not painful  Grip strength LUE 4/5 RUE 4+/5   UE AROM:  LUE: Flexion 160 deg Abduction 126 deg IR [facility]  ER needs formally assessment appears limited  RUE: Flexion 64 deg Abduction 62 deg IR [facility] ER needs formal assessment appears limited    HAND DOMINANCE:  Right                                                                                                                             TREATMENT DATE: 01/20/24 TE:  Nustep reciprocal movement and AAROM for BUE/BLE x 6 min.    Standing shoulder isometrics:  Abduction 2 x 12  Extension 2 x 12 Flexion x 12  Seated isometric adduction 2 x 12  Seated shoulder ER /scapular retraction x 12    Manual therapy: hooklye on plinth, bolster supporting BLE STM to R UT and, R anterior deltoid x cervical paraspinals STM and sub occipital release x 2 min  UT stretch on the R 2 x 45 sec with overpressure from PT.   Shoulder flexion PROM x 3, report N/T in the  pinky.   Median and radial nerve glides x 10 each on the RUE. Pt states decreased tingling upon completion and improved ROM note in to abduction.   Pt rates increased shoulder pain to 3/10 upon completion of PT treatment.   PATIENT EDUCATION:  Education details: exercise technique Person educated: Patient Education method: Explanation, Demonstration, and Verbal cues Education comprehension: verbalized understanding and returned demonstration  HOME EXERCISE PROGRAM: Access Code: [email or web address] URL: [email or web address] Date: 01/18/2024 Prepared by: Grier Rocher  Exercises - Putty Squeezes  - 2 x daily - 5-7 x weekly - 2 sets - 1 reps - 60 sec  hold - Finger Lumbricals with Putty  - 2 x daily - 5-7 x  weekly - 2 sets - 1 reps - 60 sec hold - Seated Scapular Retraction  - 1 x daily - 7 x weekly - 2 sets - 10 reps - Seated Shoulder Shrug Circles AROM Forward  - 1 x daily - 7 x weekly - 2 sets - 10 reps - Isometric Shoulder Extension at Wall  - 1 x daily - 7 x weekly - 3 sets - 10 reps - Standing Isometric Shoulder Flexion with Doorway - Arm Bent  - 1 x daily - 7 x weekly - 3 sets - 10 reps   ASSESSMENT:  CLINICAL IMPRESSION: She is highly motivated for functional activity.  Reports that shoulder is feeling much better today overall. Pain increase with activity reported at end of PT treatment. Reviewed isometric shoulder activation in  HEP. Improved technique with isometric flexion. Mild n/t with shoulder flexion beyond 90 deg. Instructing in nerve glides with reduced radicular s/s. Reports mild increase in pain upon completion of PT treatment to 3/10. The patient will benefit from further skilled PT to improve deficits in order to decrease pain, increase strength, mobility, QOL and ADL ability.   OBJECTIVE IMPAIRMENTS: Abnormal gait, decreased activity tolerance, decreased mobility, decreased ROM, decreased strength, hypomobility, impaired flexibility, impaired sensation, impaired UE functional use, improper body mechanics, postural dysfunction, and pain.   ACTIVITY LIMITATIONS: carrying, lifting, bathing, dressing, reach over head, hygiene/grooming, and locomotion level  PARTICIPATION LIMITATIONS: meal prep, cleaning, shopping, community activity, occupation, and yard work  PERSONAL FACTORS: Age, Sex, Time since onset of injury/illness/exacerbation, and 3+ comorbidities: PMH per chart: CVA, HTN, preDM, sleep apnea, bipolar disorder, R hip pain, greater trochanteric pain syndrome or RLE, primary OA of R hip  are also affecting patient's functional outcome.   REHAB POTENTIAL: Good  CLINICAL DECISION MAKING: Evolving/moderate complexity  EVALUATION COMPLEXITY: Moderate   GOALS: Goals  reviewed with patient? Yes   SHORT TERM GOALS: Target date: 02/10/2024    Patient will be independent in home exercise program to improve strength/mobility for better functional independence with ADLs. Baseline:initiated  Goal status: INITIAL   LONG TERM GOALS: Target date: 03/23/2024     Patient will decrease Quick DASH score by > 8 points demonstrating reduced self-reported upper extremity disability. Baseline: 61 Goal status: INITIAL  2.  Patient will improve R shoulder AROM to > 140 degrees of flexion, scaption, and abduction for improved ability to perform overhead activities. Baseline: flex/abd 64 deg/62 deg and pain limited Goal status: INITIAL  3.  Patient will reports a worst pain no greater than 4/10 in the past two weeks to indicate QOL improvement. Baseline: 6/10 Goal status: INITIAL  4.  Patient will increase RUE gross strength to 4+/5 as to improve functional strength for increased ADL ability. Baseline: RUE strength grossly 4-/5 Goal status: INITIAL  PLAN:  PT FREQUENCY: 1-2x/week  PT DURATION: 12 weeks  PLANNED INTERVENTIONS: 97164- PT Re-evaluation, 97110-Therapeutic exercises, 97530- Therapeutic activity, 97112- Neuromuscular re-education, 97535- Self Care, 19147- Manual therapy, [phone]- Gait training, [phone]- Orthotic Fit/training, [phone]- Splinting, Patient/Family education, Balance training, Stair training, Taping, Dry Needling, Joint mobilization, Spinal mobilization, Scar mobilization, DME instructions, Cryotherapy, and Moist heat.  PLAN FOR NEXT SESSION:  manual, PROM, isometrics. AROM as tolerated.  STM for pain management in R shoulder/cspine.   Barbara Book, PT 01/20/2024, 10:04 AM

## 2024-01-21 ENCOUNTER — Ambulatory Visit: Admitting: Physical Therapy

## 2024-01-25 ENCOUNTER — Ambulatory Visit: Admitting: Physical Therapy

## 2024-01-25 DIAGNOSIS — M25511 Pain in right shoulder: Secondary | ICD-10-CM | POA: Diagnosis not present

## 2024-01-25 DIAGNOSIS — M25611 Stiffness of right shoulder, not elsewhere classified: Secondary | ICD-10-CM

## 2024-01-25 DIAGNOSIS — G8929 Other chronic pain: Secondary | ICD-10-CM

## 2024-01-25 DIAGNOSIS — M6281 Muscle weakness (generalized): Secondary | ICD-10-CM

## 2024-01-25 NOTE — Therapy (Signed)
 OUTPATIENT PHYSICAL THERAPY TREATMENT  Patient Name: Jacqueline Steele MRN: [id number] DOB:1961/08/12, 63 y.o., female Today's Date: 01/25/2024  END OF SESSION:  PT End of Session - 01/25/24 0834     Visit Number 8    Number of Visits 25    Date for PT Re-Evaluation 03/23/24    PT Start Time 0845    PT Stop Time 0925    PT Time Calculation (min) 40 min    Activity Tolerance Patient limited by pain    Behavior During Therapy [facility] for tasks assessed/performed                 No past medical history on file. Past Surgical History:  Procedure Laterality Date   TEE WITHOUT CARDIOVERSION N/A 05/29/2022   Procedure: TRANSESOPHAGEAL ECHOCARDIOGRAM (TEE);  Surgeon: Devorah Fonder, MD;  Location: [facility] ORS;  Service: Cardiovascular;  Laterality: N/A;   TUBAL LIGATION     Patient Active Problem List   Diagnosis Date Noted   Acute stroke due to ischemia (HCC) 05/27/2022   Obesity (BMI 30-39.9) 05/27/2022    PCP: Osie Bleacher, Cleveland Dales, MD  REFERRING PROVIDER: Osie Bleacher, Cleveland Dales, MD  REFERRING DIAG:  Diagnosis  S46.011A (ICD-10-CM) - Strain of tendon of right rotator cuff    Rationale for Evaluation and Treatment: Rehabilitation  THERAPY DIAG:  Chronic right shoulder pain  Stiffness of right shoulder, not elsewhere classified  Muscle weakness (generalized)  ONSET DATE: 05/27/2022   SUBJECTIVE:                                                                                                                                                                                           SUBJECTIVE STATEMENT:   Pt reports tightness in her neck upon arrival and shoulder feeling good. Pt reports neck feels better upon end of session and her arm was tired following interventions.    PERTINENT HISTORY:  From EVAL=The pt is pleasant 63 y/o female referred to PT for strain of tendon of R RTC that developed following CVA 05/2022. Pt CVA affected R side and that she did not receive therapy  for her RUE following stroke. R side is dominant side. She cannot raise her R arm, can't lift anything due to pain. General movement hurts.  She now takes extra time to get dressed and often uses her LUE to completed ADLs. She is doing everything by herself still, but it takes longer. Pt reports she is now on disability. Pt reports worst pain is a 6/10. If she sleeps she thinks pain may get to a 0, but otherwise notices it at all times. She describes pain as  a burning sensation, "like it is being ripped."  Pt unsure of her fine motor control R hand since she is not using it much  PMH per chart: CVA, HTN, preDM, sleep apnea, bipolar disorder, R hip pain, greater trochanteric pain syndrome or RLE, primary OA of R hip  PAIN:  Are you having pain?  RUE pain, worst pain is 6/10  PRECAUTIONS:  None  RED FLAGS: Does report bladder changes since stroke (increased frequency); PT advised pt to discuss with her physician, pt verbalized understanding    WEIGHT BEARING RESTRICTIONS:  No  FALLS:  Has patient fallen in last 6 months? No  LIVING ENVIRONMENT: Lives with: lives alone Lives in: House/apartment Stairs: 4 steps with handrails  Has following equipment at home: Otho Blitz - 2 wheeled, reports not using any assistive devices   OCCUPATION:  On disability   PLOF:  Independent  PATIENT GOALS:  She'd like to be able to use her RUE, does not want to fully lose use of it.    OBJECTIVE:  Note: Objective measures were completed at Evaluation unless otherwise noted.  DIAGNOSTIC FINDINGS:  No pertinent imaging available in chart  PATIENT SURVEYS:  Quick Dash 61  COGNITIVE STATUS: Within functional limits for tasks assessed   SENSATION: Pt can feel a numbness/burning in her R arm Pt sensation intact to light touch BUE   Coordination: WFL rapid alt UE and chin to target  EDEMA:  Pt reports no swelling  POSTURE:  rounded shoulders, slight increase thoracic kyphosis   UE  MMT: LUE: 4 to 4+/5 and no pain RUE:  4-/5 * very pain limited flexion, abduction and IR; ER is only mm group not painful  Grip strength LUE 4/5 RUE 4+/5   UE AROM:  LUE: Flexion 160 deg Abduction 126 deg IR [facility]  ER needs formally assessment appears limited  RUE: Flexion 64 deg Abduction 62 deg IR [facility] ER needs formal assessment appears limited    HAND DOMINANCE:  Right                                                                                                                             TREATMENT DATE: 01/25/24 TE:   Pt had some questions regarding isometrics, pt taught for abduction isometrics to extend elbow, and maintain erect posture to better improve target muscle activation x 10 abduction and x 10   Seated UE ranger, flex / ext 2 x 10   - ER/ IR 2 x 10   - circles- clockwise and ccw x 10 ea   Manual therapy: hooklye on plinth, bolster supporting BLE STM to R UT and, R anterior deltoid x minutes cervical paraspinals STM and sub occipital release x 7 min  UT stretch with GH depression on the B 2 x 45 sec with overpressure from PT.   TE Median and radial nerve glides x 10 each on the RUE. Pt states decreased tingling upon completion and improved ROM  note in to abduction.   MHP to end session x 3 min  PATIENT EDUCATION:  Education details: exercise technique Person educated: Patient Education method: Explanation, Demonstration, and Verbal cues Education comprehension: verbalized understanding and returned demonstration  HOME EXERCISE PROGRAM: Access Code: [email or web address] URL: [email or web address] Date: 01/18/2024 Prepared by: Aurora Lees  Exercises - Putty Squeezes  - 2 x daily - 5-7 x weekly - 2 sets - 1 reps - 60 sec  hold - Finger Lumbricals with Putty  - 2 x daily - 5-7 x weekly - 2 sets - 1 reps - 60 sec hold - Seated Scapular Retraction  - 1 x daily - 7 x weekly - 2 sets - 10 reps - Seated Shoulder Shrug Circles AROM Forward  - 1 x  daily - 7 x weekly - 2 sets - 10 reps - Isometric Shoulder Extension at Wall  - 1 x daily - 7 x weekly - 3 sets - 10 reps - Standing Isometric Shoulder Flexion with Doorway - Arm Bent  - 1 x daily - 7 x weekly - 3 sets - 10 reps   ASSESSMENT:  CLINICAL IMPRESSION: Patient presents with good motivation for completion of physical therapy activities.  Patient continues to be very tense and difficulty with muscle relaxation.  Patient encouraged to focus on this at home and throughout her day in order to relieve some of the tension in her upper trap musculature.  Patient reports improved neck stiffness end of session as well as some soreness and target shoulder musculature.  Patient encouraged this is part of treatment plan and to report if soreness is any more significant in the days following PT.  Patient progressed with some active assisted range of motion with upper extremity Ranger.Pt will continue to benefit from skilled physical therapy intervention to address impairments, improve QOL, and attain therapy goals.    OBJECTIVE IMPAIRMENTS: Abnormal gait, decreased activity tolerance, decreased mobility, decreased ROM, decreased strength, hypomobility, impaired flexibility, impaired sensation, impaired UE functional use, improper body mechanics, postural dysfunction, and pain.   ACTIVITY LIMITATIONS: carrying, lifting, bathing, dressing, reach over head, hygiene/grooming, and locomotion level  PARTICIPATION LIMITATIONS: meal prep, cleaning, shopping, community activity, occupation, and yard work  PERSONAL FACTORS: Age, Sex, Time since onset of injury/illness/exacerbation, and 3+ comorbidities: PMH per chart: CVA, HTN, preDM, sleep apnea, bipolar disorder, R hip pain, greater trochanteric pain syndrome or RLE, primary OA of R hip  are also affecting patient's functional outcome.   REHAB POTENTIAL: Good  CLINICAL DECISION MAKING: Evolving/moderate complexity  EVALUATION COMPLEXITY:  Moderate   GOALS: Goals reviewed with patient? Yes   SHORT TERM GOALS: Target date: 02/10/2024    Patient will be independent in home exercise program to improve strength/mobility for better functional independence with ADLs. Baseline:initiated  Goal status: INITIAL   LONG TERM GOALS: Target date: 03/23/2024     Patient will decrease Quick DASH score by > 8 points demonstrating reduced self-reported upper extremity disability. Baseline: 61 Goal status: INITIAL  2.  Patient will improve R shoulder AROM to > 140 degrees of flexion, scaption, and abduction for improved ability to perform overhead activities. Baseline: flex/abd 64 deg/62 deg and pain limited Goal status: INITIAL  3.  Patient will reports a worst pain no greater than 4/10 in the past two weeks to indicate QOL improvement. Baseline: 6/10 Goal status: INITIAL  4.  Patient will increase RUE gross strength to 4+/5 as to improve functional strength for increased ADL ability. Baseline: RUE strength grossly 4-/5  Goal status: INITIAL     PLAN:  PT FREQUENCY: 1-2x/week  PT DURATION: 12 weeks  PLANNED INTERVENTIONS: 97164- PT Re-evaluation, 97110-Therapeutic exercises, 97530- Therapeutic activity, 97112- Neuromuscular re-education, 97535- Self Care, 16109- Manual therapy, [phone]- Gait training, [phone]- Orthotic Fit/training, [phone]- Splinting, Patient/Family education, Balance training, Stair training, Taping, Dry Needling, Joint mobilization, Spinal mobilization, Scar mobilization, DME instructions, Cryotherapy, and Moist heat.  PLAN FOR NEXT SESSION:  manual, PROM, isometrics. AROM as tolerated.  STM for pain management in R shoulder/cspine.   Edwina Gram, PT 01/25/2024, 8:34 AM

## 2024-01-27 ENCOUNTER — Ambulatory Visit

## 2024-01-27 DIAGNOSIS — M25511 Pain in right shoulder: Secondary | ICD-10-CM | POA: Diagnosis not present

## 2024-01-27 DIAGNOSIS — G8929 Other chronic pain: Secondary | ICD-10-CM

## 2024-01-27 DIAGNOSIS — M25611 Stiffness of right shoulder, not elsewhere classified: Secondary | ICD-10-CM

## 2024-01-27 DIAGNOSIS — M6281 Muscle weakness (generalized): Secondary | ICD-10-CM

## 2024-01-27 NOTE — Therapy (Signed)
 OUTPATIENT PHYSICAL THERAPY TREATMENT  Patient Name: Jacqueline Steele MRN: [id number] DOB:1961-07-16, 63 y.o., female Today's Date: 01/27/2024  END OF SESSION:  PT End of Session - 01/27/24 1306     Visit Number 9    Number of Visits 25    Date for PT Re-Evaluation 03/23/24    PT Start Time 1018    PT Stop Time 1059    PT Time Calculation (min) 41 min    Activity Tolerance Patient limited by pain    Behavior During Therapy [facility] for tasks assessed/performed                  History reviewed. No pertinent past medical history. Past Surgical History:  Procedure Laterality Date   TEE WITHOUT CARDIOVERSION N/A 05/29/2022   Procedure: TRANSESOPHAGEAL ECHOCARDIOGRAM (TEE);  Surgeon: Devorah Fonder, MD;  Location: [facility] ORS;  Service: Cardiovascular;  Laterality: N/A;   TUBAL LIGATION     Patient Active Problem List   Diagnosis Date Noted   Acute stroke due to ischemia (HCC) 05/27/2022   Obesity (BMI 30-39.9) 05/27/2022    PCP: Osie Bleacher, Cleveland Dales, MD  REFERRING PROVIDER: Osie Bleacher, Cleveland Dales, MD  REFERRING DIAG:  Diagnosis  S46.011A (ICD-10-CM) - Strain of tendon of right rotator cuff    Rationale for Evaluation and Treatment: Rehabilitation  THERAPY DIAG:  Chronic right shoulder pain  Muscle weakness (generalized)  Stiffness of right shoulder, not elsewhere classified  ONSET DATE: 05/27/2022   SUBJECTIVE:                                                                                                                                                                                           SUBJECTIVE STATEMENT:   Pt reports some shoulder/neck pain thinks she slept on it wrong.   PERTINENT HISTORY:  From EVAL=The pt is pleasant 64 y/o female referred to PT for strain of tendon of R RTC that developed following CVA 05/2022. Pt CVA affected R side and that she did not receive therapy for her RUE following stroke. R side is dominant side. She cannot raise her R arm,  can't lift anything due to pain. General movement hurts.  She now takes extra time to get dressed and often uses her LUE to completed ADLs. She is doing everything by herself still, but it takes longer. Pt reports she is now on disability. Pt reports worst pain is a 6/10. If she sleeps she thinks pain may get to a 0, but otherwise notices it at all times. She describes pain as a burning sensation, "like it is being ripped."  Pt unsure of her fine motor control  R hand since she is not using it much  PMH per chart: CVA, HTN, preDM, sleep apnea, bipolar disorder, R hip pain, greater trochanteric pain syndrome or RLE, primary OA of R hip  PAIN:  Are you having pain?  RUE pain, worst pain is 6/10  PRECAUTIONS:  None  RED FLAGS: Does report bladder changes since stroke (increased frequency); PT advised pt to discuss with her physician, pt verbalized understanding    WEIGHT BEARING RESTRICTIONS:  No  FALLS:  Has patient fallen in last 6 months? No  LIVING ENVIRONMENT: Lives with: lives alone Lives in: House/apartment Stairs: 4 steps with handrails  Has following equipment at home: Otho Blitz - 2 wheeled, reports not using any assistive devices   OCCUPATION:  On disability   PLOF:  Independent  PATIENT GOALS:  She'd like to be able to use her RUE, does not want to fully lose use of it.    OBJECTIVE:  Note: Objective measures were completed at Evaluation unless otherwise noted.  DIAGNOSTIC FINDINGS:  No pertinent imaging available in chart  PATIENT SURVEYS:  Quick Dash 61  COGNITIVE STATUS: Within functional limits for tasks assessed   SENSATION: Pt can feel a numbness/burning in her R arm Pt sensation intact to light touch BUE   Coordination: WFL rapid alt UE and chin to target  EDEMA:  Pt reports no swelling  POSTURE:  rounded shoulders, slight increase thoracic kyphosis   UE MMT: LUE: 4 to 4+/5 and no pain RUE:  4-/5 * very pain limited flexion, abduction and IR;  ER is only mm group not painful  Grip strength LUE 4/5 RUE 4+/5   UE AROM:  LUE: Flexion 160 deg Abduction 126 deg IR [facility]  ER needs formally assessment appears limited  RUE: Flexion 64 deg Abduction 62 deg IR [facility] ER needs formal assessment appears limited    HAND DOMINANCE:  Right                                                                                                                             TREATMENT DATE: 01/27/24   Manual -  Heat applied to R shoulder while manual provided. STM to R posterior shoulder mm, R anterior delt, and R cervial parapsinals  Additional pt cued through cervical lat flexion and rotation stretch. X several minutes. Pt reports heat feels good, no adverse reaction to heat, skin WNL prior to and upon removal of heat   TE: AAROM: Flexion in 45 deg abduction (neutral position is too pain limited) 10x through limited range Abduction  x multiple reps PVC AAROM chest press 2x10 - pain improves with reps   UE Ranger- RUE Circles cw/cc 10x for each Flex/ext 10x each direction  Horizontal abduction/adduction 10x for each   Seated ulnar nerve glide 10x - modified  Seated median nerve glide 10x - modified -  pt felt a grab like sensation with one rep at distal bicep   Elbow flexed  90 deg, mini flexion 2x10  Elbow flexed 90 deg, mini abduction 2x10 Seated shoulder IR/ER 10x each way  Seated UT stretch R x 30 sec limited by pain felt in R lower cerivical paraspinals  Cervical rotation bilat x multiple reps helps pain with PT also providing TrP release to lower R cervical parapsinals      PATIENT EDUCATION:  Education details: exercise technique Person educated: Patient Education method: Explanation, Demonstration, and Verbal cues Education comprehension: verbalized understanding and returned demonstration  HOME EXERCISE PROGRAM: Access Code: [email or web address] URL: [email or web address] Date: 01/18/2024 Prepared by: Aurora Lees  Exercises - Putty Squeezes  - 2 x daily - 5-7 x weekly - 2 sets - 1 reps - 60 sec  hold - Finger Lumbricals with Putty  - 2 x daily - 5-7 x weekly - 2 sets - 1 reps - 60 sec hold - Seated Scapular Retraction  - 1 x daily - 7 x weekly - 2 sets - 10 reps - Seated Shoulder Shrug Circles AROM Forward  - 1 x daily - 7 x weekly - 2 sets - 10 reps - Isometric Shoulder Extension at Wall  - 1 x daily - 7 x weekly - 3 sets - 10 reps - Standing Isometric Shoulder Flexion with Doorway - Arm Bent  - 1 x daily - 7 x weekly - 3 sets - 10 reps   ASSESSMENT:  CLINICAL IMPRESSION: Chartered loss adjuster continued plan as laid out in recent sessions. Pt with reports of noted improvement outside of therapy with use of RUE, still significantly limited with AAROM/AROM per observation in session and with interventions. Pt responds well with use of heat as adjunct therapy..Pt will continue to benefit from skilled physical therapy intervention to address impairments, improve QOL, and attain therapy goals.    OBJECTIVE IMPAIRMENTS: Abnormal gait, decreased activity tolerance, decreased mobility, decreased ROM, decreased strength, hypomobility, impaired flexibility, impaired sensation, impaired UE functional use, improper body mechanics, postural dysfunction, and pain.   ACTIVITY LIMITATIONS: carrying, lifting, bathing, dressing, reach over head, hygiene/grooming, and locomotion level  PARTICIPATION LIMITATIONS: meal prep, cleaning, shopping, community activity, occupation, and yard work  PERSONAL FACTORS: Age, Sex, Time since onset of injury/illness/exacerbation, and 3+ comorbidities: PMH per chart: CVA, HTN, preDM, sleep apnea, bipolar disorder, R hip pain, greater trochanteric pain syndrome or RLE, primary OA of R hip  are also affecting patient's functional outcome.   REHAB POTENTIAL: Good  CLINICAL DECISION MAKING: Evolving/moderate complexity  EVALUATION COMPLEXITY: Moderate   GOALS: Goals reviewed with patient?  Yes   SHORT TERM GOALS: Target date: 02/10/2024    Patient will be independent in home exercise program to improve strength/mobility for better functional independence with ADLs. Baseline:initiated  Goal status: INITIAL   LONG TERM GOALS: Target date: 03/23/2024     Patient will decrease Quick DASH score by > 8 points demonstrating reduced self-reported upper extremity disability. Baseline: 61 Goal status: INITIAL  2.  Patient will improve R shoulder AROM to > 140 degrees of flexion, scaption, and abduction for improved ability to perform overhead activities. Baseline: flex/abd 64 deg/62 deg and pain limited Goal status: INITIAL  3.  Patient will reports a worst pain no greater than 4/10 in the past two weeks to indicate QOL improvement. Baseline: 6/10 Goal status: INITIAL  4.  Patient will increase RUE gross strength to 4+/5 as to improve functional strength for increased ADL ability. Baseline: RUE strength grossly 4-/5 Goal status: INITIAL     PLAN:  PT FREQUENCY: 1-2x/week  PT DURATION: 12 weeks  PLANNED INTERVENTIONS: 97164- PT Re-evaluation, 97110-Therapeutic exercises, 97530- Therapeutic activity, 97112- Neuromuscular re-education, 97535- Self Care, 56213- Manual therapy, [phone]- Gait training, [phone]- Orthotic Fit/training, [phone]- Splinting, Patient/Family education, Balance training, Stair training, Taping, Dry Needling, Joint mobilization, Spinal mobilization, Scar mobilization, DME instructions, Cryotherapy, and Moist heat.  PLAN FOR NEXT SESSION:  manual, PROM, isometrics. AROM as tolerated.  STM for pain management in R shoulder/cspine.   Samie Crews, PT 01/27/2024, 1:07 PM

## 2024-01-28 ENCOUNTER — Ambulatory Visit: Admitting: Physical Therapy

## 2024-01-31 ENCOUNTER — Ambulatory Visit

## 2024-01-31 DIAGNOSIS — G8929 Other chronic pain: Secondary | ICD-10-CM

## 2024-01-31 DIAGNOSIS — M25611 Stiffness of right shoulder, not elsewhere classified: Secondary | ICD-10-CM

## 2024-01-31 DIAGNOSIS — M25511 Pain in right shoulder: Secondary | ICD-10-CM | POA: Diagnosis not present

## 2024-01-31 DIAGNOSIS — M6281 Muscle weakness (generalized): Secondary | ICD-10-CM

## 2024-01-31 NOTE — Therapy (Signed)
 OUTPATIENT PHYSICAL THERAPY TREATMENT/Physical Therapy Progress Note   Dates of reporting period  12/30/2023   to   01/31/2024   Patient Name: Jacqueline Steele MRN: [id number] DOB:05/15/1961, 63 y.o., female Today's Date: 01/31/2024  END OF SESSION:  PT End of Session - 01/31/24 0935     Visit Number 10    Number of Visits 25    Date for PT Re-Evaluation 03/23/24    PT Start Time 0936    PT Stop Time 1012    PT Time Calculation (min) 36 min    Activity Tolerance Patient limited by pain    Behavior During Therapy [facility] for tasks assessed/performed                  History reviewed. No pertinent past medical history. Past Surgical History:  Procedure Laterality Date   TEE WITHOUT CARDIOVERSION N/A 05/29/2022   Procedure: TRANSESOPHAGEAL ECHOCARDIOGRAM (TEE);  Surgeon: Devorah Fonder, MD;  Location: [facility] ORS;  Service: Cardiovascular;  Laterality: N/A;   TUBAL LIGATION     Patient Active Problem List   Diagnosis Date Noted   Acute stroke due to ischemia (HCC) 05/27/2022   Obesity (BMI 30-39.9) 05/27/2022    PCP: Osie Bleacher, Cleveland Dales, MD  REFERRING PROVIDER: Osie Bleacher, Cleveland Dales, MD  REFERRING DIAG:  Diagnosis  S46.011A (ICD-10-CM) - Strain of tendon of right rotator cuff    Rationale for Evaluation and Treatment: Rehabilitation  THERAPY DIAG:  Chronic right shoulder pain  Stiffness of right shoulder, not elsewhere classified  Muscle weakness (generalized)  ONSET DATE: 05/27/2022   SUBJECTIVE:                                                                                                                                                                                           SUBJECTIVE STATEMENT:   Pt was very tired yesterday. Pt reports improvement in RUE pain. Using RUE somewhat more but still limited.   PERTINENT HISTORY:  From EVAL=The pt is pleasant 63 y/o female referred to PT for strain of tendon of R RTC that developed following CVA 05/2022. Pt CVA  affected R side and that she did not receive therapy for her RUE following stroke. R side is dominant side. She cannot raise her R arm, can't lift anything due to pain. General movement hurts.  She now takes extra time to get dressed and often uses her LUE to completed ADLs. She is doing everything by herself still, but it takes longer. Pt reports she is now on disability. Pt reports worst pain is a 6/10. If she sleeps she thinks pain may get to a 0, but otherwise  notices it at all times. She describes pain as a burning sensation, "like it is being ripped."  Pt unsure of her fine motor control R hand since she is not using it much  PMH per chart: CVA, HTN, preDM, sleep apnea, bipolar disorder, R hip pain, greater trochanteric pain syndrome or RLE, primary OA of R hip  PAIN:  Are you having pain?  RUE pain, worst pain is 6/10  PRECAUTIONS:  None  RED FLAGS: Does report bladder changes since stroke (increased frequency); PT advised pt to discuss with her physician, pt verbalized understanding    WEIGHT BEARING RESTRICTIONS:  No  FALLS:  Has patient fallen in last 6 months? No  LIVING ENVIRONMENT: Lives with: lives alone Lives in: House/apartment Stairs: 4 steps with handrails  Has following equipment at home: Otho Blitz - 2 wheeled, reports not using any assistive devices   OCCUPATION:  On disability   PLOF:  Independent  PATIENT GOALS:  She'd like to be able to use her RUE, does not want to fully lose use of it.    OBJECTIVE:  Note: Objective measures were completed at Evaluation unless otherwise noted.  DIAGNOSTIC FINDINGS:  No pertinent imaging available in chart  PATIENT SURVEYS:  Quick Dash 61  COGNITIVE STATUS: Within functional limits for tasks assessed   SENSATION: Pt can feel a numbness/burning in her R arm Pt sensation intact to light touch BUE   Coordination: WFL rapid alt UE and chin to target  EDEMA:  Pt reports no swelling  POSTURE:  rounded  shoulders, slight increase thoracic kyphosis   UE MMT: LUE: 4 to 4+/5 and no pain RUE:  4-/5 * very pain limited flexion, abduction and IR; ER is only mm group not painful  Grip strength LUE 4/5 RUE 4+/5   UE AROM:  LUE: Flexion 160 deg Abduction 126 deg IR [facility]  ER needs formally assessment appears limited  RUE: Flexion 64 deg Abduction 62 deg IR [facility] ER needs formal assessment appears limited    HAND DOMINANCE:  Right                                                                                                                             TREATMENT DATE: 01/31/24  Goal reassessment for progress visit  TE: Worst pain in past two weeks? 4/10, but has modified activities/movement to avoid pain   RUE AROM measurements: Flexion 80 deg, abduction 65 deg, scaption 50 deg All pain-limited  MMT RUE: Pain-limited & strength grossly 4-/5   NDI: 70.45%  Manual -  Heat applied to R shoulder while manual provided. STM to R posterior shoulder mm, R anterior delt, R lower cervical parapsinals, R bicep  No adverse reaction to heat, skin WNL prior to and upon removal of heat   TE: PROM abd/scap/ER/IR/flex  x multiple reps, pain limited RUE serratus punch 2x10 - pain limited  Isometric shoulder ext 2x10  Manually resisted ER/IR 2x10 for each RUE  bicep curl 2# 2x10 - rates hard    Seated shoulder  shrugs 10x bilat - no pain  Shoulder shrug with 2# dumbbell RUE 10x     PATIENT EDUCATION:  Education details: exercise technique, goals Person educated: Patient Education method: Explanation, Demonstration, and Verbal cues Education comprehension: verbalized understanding and returned demonstration  HOME EXERCISE PROGRAM: Access Code: [email or web address] URL: [email or web address] Date: 01/18/2024 Prepared by: Aurora Lees  Exercises - Putty Squeezes  - 2 x daily - 5-7 x weekly - 2 sets - 1 reps - 60 sec  hold - Finger Lumbricals with Putty  - 2 x daily -  5-7 x weekly - 2 sets - 1 reps - 60 sec hold - Seated Scapular Retraction  - 1 x daily - 7 x weekly - 2 sets - 10 reps - Seated Shoulder Shrug Circles AROM Forward  - 1 x daily - 7 x weekly - 2 sets - 10 reps - Isometric Shoulder Extension at Wall  - 1 x daily - 7 x weekly - 3 sets - 10 reps - Standing Isometric Shoulder Flexion with Doorway - Arm Bent  - 1 x daily - 7 x weekly - 3 sets - 10 reps   ASSESSMENT:  CLINICAL IMPRESSION: Goal reassessment completed for progress visit. Pt with mixed performance: shows some improvement with AROM of R shoulder although still pain-limited. Pt strength of RUE remains unchanged and pt with worse performance on NDI. However, pt did meet pain goal with decrease in worst pain in past two weeks to 4/10, and pt does report she feels her RUE is a bit better. Pt will continue to benefit from skilled physical therapy intervention to address impairments, improve QOL, and attain therapy goals.    OBJECTIVE IMPAIRMENTS: Abnormal gait, decreased activity tolerance, decreased mobility, decreased ROM, decreased strength, hypomobility, impaired flexibility, impaired sensation, impaired UE functional use, improper body mechanics, postural dysfunction, and pain.   ACTIVITY LIMITATIONS: carrying, lifting, bathing, dressing, reach over head, hygiene/grooming, and locomotion level  PARTICIPATION LIMITATIONS: meal prep, cleaning, shopping, community activity, occupation, and yard work  PERSONAL FACTORS: Age, Sex, Time since onset of injury/illness/exacerbation, and 3+ comorbidities: PMH per chart: CVA, HTN, preDM, sleep apnea, bipolar disorder, R hip pain, greater trochanteric pain syndrome or RLE, primary OA of R hip  are also affecting patient's functional outcome.   REHAB POTENTIAL: Good  CLINICAL DECISION MAKING: Evolving/moderate complexity  EVALUATION COMPLEXITY: Moderate   GOALS: Goals reviewed with patient? Yes   SHORT TERM GOALS: Target date:  02/10/2024    Patient will be independent in home exercise program to improve strength/mobility for better functional independence with ADLs. Baseline:initiated; 4/28: pt performing HEP, to be advanced Goal status: IN PROGRESS   LONG TERM GOALS: Target date: 03/23/2024    Patient will decrease Quick DASH score by > 8 points demonstrating reduced self-reported upper extremity disability. Baseline: 61: 4/28: 70.45%  Goal status: IN PROGRESS  2.  Patient will improve R shoulder AROM to > 140 degrees of flexion, scaption, and abduction for improved ability to perform overhead activities. Baseline: flex/abd 64 deg/62 deg and pain limited; 01/31/24: flex 80 deg, abd 65 deg, scaption 50 deg Goal status: IN PROGRESS  3.  Patient will reports a worst pain no greater than 4/10 in the past two weeks to indicate QOL improvement. Baseline: 6/10; 01/31/24: 4/10 but has modified activities to reduce increased pain Goal status: MET  4.  Patient will increase RUE gross strength to 4+/5 as to improve functional strength  for increased ADL ability. Baseline: RUE strength grossly 4-/5; 01/31/24: pain limited grossly 4-/5  Goal status: IN PROGRESS     PLAN:  PT FREQUENCY: 1-2x/week  PT DURATION: 12 weeks  PLANNED INTERVENTIONS: 97164- PT Re-evaluation, 97110-Therapeutic exercises, 97530- Therapeutic activity, 97112- Neuromuscular re-education, 97535- Self Care, 16109- Manual therapy, [phone]- Gait training, [phone]- Orthotic Fit/training, [phone]- Splinting, Patient/Family education, Balance training, Stair training, Taping, Dry Needling, Joint mobilization, Spinal mobilization, Scar mobilization, DME instructions, Cryotherapy, and Moist heat.  PLAN FOR NEXT SESSION:  manual, PROM, isometrics. AROM as tolerated.  STM for pain management in R shoulder/cspine.   Samie Crews, PT 01/31/2024, 10:37 AM

## 2024-02-01 ENCOUNTER — Encounter: Admitting: Physical Therapy

## 2024-02-02 ENCOUNTER — Ambulatory Visit: Admitting: Physical Therapy

## 2024-02-02 DIAGNOSIS — M6281 Muscle weakness (generalized): Secondary | ICD-10-CM

## 2024-02-02 DIAGNOSIS — G8929 Other chronic pain: Secondary | ICD-10-CM

## 2024-02-02 DIAGNOSIS — M25611 Stiffness of right shoulder, not elsewhere classified: Secondary | ICD-10-CM

## 2024-02-02 DIAGNOSIS — M25511 Pain in right shoulder: Secondary | ICD-10-CM | POA: Diagnosis not present

## 2024-02-02 NOTE — Therapy (Signed)
 OUTPATIENT PHYSICAL THERAPY TREATMENT  Patient Name: Jacqueline Steele MRN: [id number] DOB:1961/07/31, 63 y.o., female Today's Date: 02/02/2024  END OF SESSION:  PT End of Session - 02/02/24 0850     Visit Number 11    Number of Visits 25    Date for PT Re-Evaluation 03/23/24    PT Start Time 0850    PT Stop Time 0930    PT Time Calculation (min) 40 min    Activity Tolerance Patient limited by pain    Behavior During Therapy [facility] for tasks assessed/performed                  No past medical history on file. Past Surgical History:  Procedure Laterality Date   TEE WITHOUT CARDIOVERSION N/A 05/29/2022   Procedure: TRANSESOPHAGEAL ECHOCARDIOGRAM (TEE);  Surgeon: Devorah Fonder, MD;  Location: [facility] ORS;  Service: Cardiovascular;  Laterality: N/A;   TUBAL LIGATION     Patient Active Problem List   Diagnosis Date Noted   Acute stroke due to ischemia (HCC) 05/27/2022   Obesity (BMI 30-39.9) 05/27/2022    PCP: Osie Bleacher, Cleveland Dales, MD  REFERRING PROVIDER: Osie Bleacher, Cleveland Dales, MD  REFERRING DIAG:  Diagnosis  S46.011A (ICD-10-CM) - Strain of tendon of right rotator cuff    Rationale for Evaluation and Treatment: Rehabilitation  THERAPY DIAG:  Chronic right shoulder pain  Stiffness of right shoulder, not elsewhere classified  Muscle weakness (generalized)  ONSET DATE: 05/27/2022   SUBJECTIVE:                                                                                                                                                                                           SUBJECTIVE STATEMENT:   Pt reports that she is doing well. No pain at start of PT treatment. And states that she slept much better last night.    PERTINENT HISTORY:  From EVAL=The pt is pleasant 63 y/o female referred to PT for strain of tendon of R RTC that developed following CVA 05/2022. Pt CVA affected R side and that she did not receive therapy for her RUE following stroke. R side is dominant  side. She cannot raise her R arm, can't lift anything due to pain. General movement hurts.  She now takes extra time to get dressed and often uses her LUE to completed ADLs. She is doing everything by herself still, but it takes longer. Pt reports she is now on disability. Pt reports worst pain is a 6/10. If she sleeps she thinks pain may get to a 0, but otherwise notices it at all times. She describes pain as a burning sensation, "like  it is being ripped."  Pt unsure of her fine motor control R hand since she is not using it much  PMH per chart: CVA, HTN, preDM, sleep apnea, bipolar disorder, R hip pain, greater trochanteric pain syndrome or RLE, primary OA of R hip  PAIN:  Are you having pain?  RUE pain, worst pain is 6/10  PRECAUTIONS:  None  RED FLAGS: Does report bladder changes since stroke (increased frequency); PT advised pt to discuss with her physician, pt verbalized understanding    WEIGHT BEARING RESTRICTIONS:  No  FALLS:  Has patient fallen in last 6 months? No  LIVING ENVIRONMENT: Lives with: lives alone Lives in: House/apartment Stairs: 4 steps with handrails  Has following equipment at home: Otho Blitz - 2 wheeled, reports not using any assistive devices   OCCUPATION:  On disability   PLOF:  Independent  PATIENT GOALS:  She'd like to be able to use her RUE, does not want to fully lose use of it.    OBJECTIVE:  Note: Objective measures were completed at Evaluation unless otherwise noted.  DIAGNOSTIC FINDINGS:  No pertinent imaging available in chart  PATIENT SURVEYS:  Quick Dash 61  COGNITIVE STATUS: Within functional limits for tasks assessed   SENSATION: Pt can feel a numbness/burning in her R arm Pt sensation intact to light touch BUE   Coordination: WFL rapid alt UE and chin to target  EDEMA:  Pt reports no swelling  POSTURE:  rounded shoulders, slight increase thoracic kyphosis   UE MMT: LUE: 4 to 4+/5 and no pain RUE:  4-/5 * very pain  limited flexion, abduction and IR; ER is only mm group not painful  Grip strength LUE 4/5 RUE 4+/5   UE AROM:  LUE: Flexion 160 deg Abduction 126 deg IR [facility]  ER needs formally assessment appears limited  RUE: Flexion 64 deg Abduction 62 deg IR [facility] ER needs formal assessment appears limited    HAND DOMINANCE:  Right                                                                                                                             TREATMENT DATE: 02/02/24  Goal reassessment for progress visit  TE: UE ranger.  Flexion/extension x 10 with static trunk  Flexion/etexion with forward lean x 12  Shoulder horizontal abduction/adduction x 12  Standing small cirlces with shoulder at 90 deg on the LUE and 60 deg on the RUE x 8 each.   ER with scapular setting YTB x 12  Single limb low row x 12 YTB   Supine  bicep curl x 12  Hammer curl x 12  Tricep extension with shoulder at 90deg 2 x 12  Rates each as easy to medium.   Manual:  Slow PROM  from 70 deg flexion with constant over pressure x 5 min to achieve shoulder flexion of 120deg. Pt reports only pain with return to anatomical position.  STM to TP in  UT and supraspinatus x 5 min total     PATIENT EDUCATION:  Education details: exercise technique, goals Person educated: Patient Education method: Explanation, Demonstration, and Verbal cues Education comprehension: verbalized understanding and returned demonstration  HOME EXERCISE PROGRAM: Access Code: [email or web address] URL: [email or web address] Date: 01/18/2024 Prepared by: Aurora Lees  Exercises - Putty Squeezes  - 2 x daily - 5-7 x weekly - 2 sets - 1 reps - 60 sec  hold - Finger Lumbricals with Putty  - 2 x daily - 5-7 x weekly - 2 sets - 1 reps - 60 sec hold - Seated Scapular Retraction  - 1 x daily - 7 x weekly - 2 sets - 10 reps - Seated Shoulder Shrug Circles AROM Forward  - 1 x daily - 7 x weekly - 2 sets - 10 reps - Isometric Shoulder  Extension at Wall  - 1 x daily - 7 x weekly - 3 sets - 10 reps - Standing Isometric Shoulder Flexion with Doorway - Arm Bent  - 1 x daily - 7 x weekly - 3 sets - 10 reps   ASSESSMENT:  CLINICAL IMPRESSION: Pt arrived motivated to participate. Continues to have pain with active shoulder flexion at >60deg as well as bicep flexion intermittently.  Increased time in slow constant overpressure into flexion to achieve total 120deg flexion. Able to perform stabilization with ranger and AAROM with limited pain with shoulder <90 deg flexion. Pt will continue to benefit from skilled physical therapy intervention to address impairments, improve QOL, and attain therapy goals.    OBJECTIVE IMPAIRMENTS: Abnormal gait, decreased activity tolerance, decreased mobility, decreased ROM, decreased strength, hypomobility, impaired flexibility, impaired sensation, impaired UE functional use, improper body mechanics, postural dysfunction, and pain.   ACTIVITY LIMITATIONS: carrying, lifting, bathing, dressing, reach over head, hygiene/grooming, and locomotion level  PARTICIPATION LIMITATIONS: meal prep, cleaning, shopping, community activity, occupation, and yard work  PERSONAL FACTORS: Age, Sex, Time since onset of injury/illness/exacerbation, and 3+ comorbidities: PMH per chart: CVA, HTN, preDM, sleep apnea, bipolar disorder, R hip pain, greater trochanteric pain syndrome or RLE, primary OA of R hip  are also affecting patient's functional outcome.   REHAB POTENTIAL: Good  CLINICAL DECISION MAKING: Evolving/moderate complexity  EVALUATION COMPLEXITY: Moderate   GOALS: Goals reviewed with patient? Yes   SHORT TERM GOALS: Target date: 02/10/2024    Patient will be independent in home exercise program to improve strength/mobility for better functional independence with ADLs. Baseline:initiated; 4/28: pt performing HEP, to be advanced Goal status: IN PROGRESS   LONG TERM GOALS: Target date: 03/23/2024     Patient will decrease Quick DASH score by > 8 points demonstrating reduced self-reported upper extremity disability. Baseline: 61: 4/28: 70.45%  Goal status: IN PROGRESS  2.  Patient will improve R shoulder AROM to > 140 degrees of flexion, scaption, and abduction for improved ability to perform overhead activities. Baseline: flex/abd 64 deg/62 deg and pain limited; 01/31/24: flex 80 deg, abd 65 deg, scaption 50 deg Goal status: IN PROGRESS  3.  Patient will reports a worst pain no greater than 4/10 in the past two weeks to indicate QOL improvement. Baseline: 6/10; 01/31/24: 4/10 but has modified activities to reduce increased pain Goal status: MET  4.  Patient will increase RUE gross strength to 4+/5 as to improve functional strength for increased ADL ability. Baseline: RUE strength grossly 4-/5; 01/31/24: pain limited grossly 4-/5  Goal status: IN PROGRESS     PLAN:  PT FREQUENCY: 1-2x/week  PT DURATION: 12  weeks  PLANNED INTERVENTIONS: 97164- PT Re-evaluation, 97110-Therapeutic exercises, 97530- Therapeutic activity, 97112- Neuromuscular re-education, 97535- Self Care, 95621- Manual therapy, [phone]- Gait training, [phone]- Orthotic Fit/training, [phone]- Splinting, Patient/Family education, Balance training, Stair training, Taping, Dry Needling, Joint mobilization, Spinal mobilization, Scar mobilization, DME instructions, Cryotherapy, and Moist heat.  PLAN FOR NEXT SESSION:  manual, PROM, isometrics. AROM as tolerated.  STM for pain management in R shoulder/cspine.   Barbara Book, PT 02/02/2024, 8:50 AM

## 2024-02-07 ENCOUNTER — Ambulatory Visit: Attending: Internal Medicine

## 2024-02-07 DIAGNOSIS — M6281 Muscle weakness (generalized): Secondary | ICD-10-CM | POA: Insufficient documentation

## 2024-02-07 DIAGNOSIS — G8929 Other chronic pain: Secondary | ICD-10-CM | POA: Diagnosis present

## 2024-02-07 DIAGNOSIS — M25511 Pain in right shoulder: Secondary | ICD-10-CM | POA: Insufficient documentation

## 2024-02-07 DIAGNOSIS — M25611 Stiffness of right shoulder, not elsewhere classified: Secondary | ICD-10-CM | POA: Diagnosis present

## 2024-02-07 NOTE — Therapy (Signed)
 OUTPATIENT PHYSICAL THERAPY TREATMENT  Patient Name: Jacqueline Steele MRN: [id number] DOB:[birth year], 63 y.o., female Today's Date: 02/07/2024  END OF SESSION:         No past medical history on file. Past Surgical History:  Procedure Laterality Date   TEE WITHOUT CARDIOVERSION N/A 05/29/2022   Procedure: TRANSESOPHAGEAL ECHOCARDIOGRAM (TEE);  Surgeon: Devorah Fonder, MD;  Location: [facility] ORS;  Service: Cardiovascular;  Laterality: N/A;   TUBAL LIGATION     Patient Active Problem List   Diagnosis Date Noted   Acute stroke due to ischemia (HCC) 05/27/2022   Obesity (BMI 30-39.9) 05/27/2022    PCP: Kathrynn Park, MD  REFERRING PROVIDER: Osie Bleacher, Cleveland Dales, MD  REFERRING DIAG:  Diagnosis  S46.011A (ICD-10-CM) - Strain of tendon of right rotator cuff    Rationale for Evaluation and Treatment: Rehabilitation  THERAPY DIAG:  No diagnosis found.  ONSET DATE: 05/27/2022   SUBJECTIVE:                                                                                                                                                                                           SUBJECTIVE STATEMENT:   Pt reports improved movement of RUE following last visit.   PERTINENT HISTORY:  From EVAL=The pt is pleasant 63 y/o female referred to PT for strain of tendon of R RTC that developed following CVA 05/2022. Pt CVA affected R side and that she did not receive therapy for her RUE following stroke. R side is dominant side. She cannot raise her R arm, can't lift anything due to pain. General movement hurts.  She now takes extra time to get dressed and often uses her LUE to completed ADLs. She is doing everything by herself still, but it takes longer. Pt reports she is now on disability. Pt reports worst pain is a 6/10. If she sleeps she thinks pain may get to a 0, but otherwise notices it at all times. She describes pain as a burning sensation, "like it is being ripped."  Pt unsure of her fine  motor control R hand since she is not using it much  PMH per chart: CVA, HTN, preDM, sleep apnea, bipolar disorder, R hip pain, greater trochanteric pain syndrome or RLE, primary OA of R hip  PAIN:  Are you having pain?  RUE pain, worst pain is 6/10  PRECAUTIONS:  None  RED FLAGS: Does report bladder changes since stroke (increased frequency); PT advised pt to discuss with her physician, pt verbalized understanding    WEIGHT BEARING RESTRICTIONS:  No  FALLS:  Has patient fallen in last 6 months? No  LIVING ENVIRONMENT: Lives  with: lives alone Lives in: House/apartment Stairs: 4 steps with handrails  Has following equipment at home: Otho Blitz - 2 wheeled, reports not using any assistive devices   OCCUPATION:  On disability   PLOF:  Independent  PATIENT GOALS:  She'd like to be able to use her RUE, does not want to fully lose use of it.    OBJECTIVE:  Note: Objective measures were completed at Evaluation unless otherwise noted.  DIAGNOSTIC FINDINGS:  No pertinent imaging available in chart  PATIENT SURVEYS:  Quick Dash 61  COGNITIVE STATUS: Within functional limits for tasks assessed   SENSATION: Pt can feel a numbness/burning in her R arm Pt sensation intact to light touch BUE   Coordination: WFL rapid alt UE and chin to target  EDEMA:  Pt reports no swelling  POSTURE:  rounded shoulders, slight increase thoracic kyphosis   UE MMT: LUE: 4 to 4+/5 and no pain RUE:  4-/5 * very pain limited flexion, abduction and IR; ER is only mm group not painful  Grip strength LUE 4/5 RUE 4+/5   UE AROM:  LUE: Flexion 160 deg Abduction 126 deg IR [facility]  ER needs formally assessment appears limited  RUE: Flexion 64 deg Abduction 62 deg IR [facility] ER needs formal assessment appears limited    HAND DOMINANCE:  Right                                                                                                                             TREATMENT DATE:  02/07/24   TE: Nustep UE/LE x 4 min x lvl 1 -- pt reports feels good   UE ranger.  Flexion/extension x 10  Shoulder horizontal abduction/adduction x 20 Standing small cirlces with shoulder 10x cw and 10x cc   Wall ladder flexion 2x  Isometric at wall R flex/ext 10x for each (reports ext easier)  Supine  bicep curl 2x12 with 1# weight  Serratus punch x10 with 1#  - limited ROM  Manual:  STM and TrP release to R UT, anterior delt, bicep and R posterior shoulder mm and cervical paraspinals. Additional cuing into long duration R levator stretch while providing manual PROM R shoulder flexion/ext/ER/IR x multiple reps of each. Exhibits improved flexion. Heat donned throughout to R shoulder while PT provided manual therapy, no adverse reaction to heat, skin WNL prior to and upon removal of heat    PATIENT EDUCATION:  Education details: exercise technique,  Person educated: Patient Education method: Explanation, Demonstration, and Verbal cues Education comprehension: verbalized understanding and returned demonstration  HOME EXERCISE PROGRAM: Access Code: [email or web address] URL: [email or web address] Date: 01/18/2024 Prepared by: Aurora Lees  Exercises - Putty Squeezes  - 2 x daily - 5-7 x weekly - 2 sets - 1 reps - 60 sec  hold - Finger Lumbricals with Putty  - 2 x daily - 5-7 x weekly - 2 sets - 1 reps - 60 sec hold - Seated Scapular Retraction  - 1  x daily - 7 x weekly - 2 sets - 10 reps - Seated Shoulder Shrug Circles AROM Forward  - 1 x daily - 7 x weekly - 2 sets - 10 reps - Isometric Shoulder Extension at Wall  - 1 x daily - 7 x weekly - 3 sets - 10 reps - Standing Isometric Shoulder Flexion with Doorway - Arm Bent  - 1 x daily - 7 x weekly - 3 sets - 10 reps   ASSESSMENT:  CLINICAL IMPRESSION: Pt with observed improved R shoulder flexion, though still pain limited. Additionally progressed pt to use of nustep with using BUE. Pt with no pain with intervention in  session but PT did instruct pt to monitor symptoms and report back.. Pt will continue to benefit from skilled physical therapy intervention to address impairments, improve QOL, and attain therapy goals.    OBJECTIVE IMPAIRMENTS: Abnormal gait, decreased activity tolerance, decreased mobility, decreased ROM, decreased strength, hypomobility, impaired flexibility, impaired sensation, impaired UE functional use, improper body mechanics, postural dysfunction, and pain.   ACTIVITY LIMITATIONS: carrying, lifting, bathing, dressing, reach over head, hygiene/grooming, and locomotion level  PARTICIPATION LIMITATIONS: meal prep, cleaning, shopping, community activity, occupation, and yard work  PERSONAL FACTORS: Age, Sex, Time since onset of injury/illness/exacerbation, and 3+ comorbidities: PMH per chart: CVA, HTN, preDM, sleep apnea, bipolar disorder, R hip pain, greater trochanteric pain syndrome or RLE, primary OA of R hip  are also affecting patient's functional outcome.   REHAB POTENTIAL: Good  CLINICAL DECISION MAKING: Evolving/moderate complexity  EVALUATION COMPLEXITY: Moderate   GOALS: Goals reviewed with patient? Yes   SHORT TERM GOALS: Target date: 02/10/2024    Patient will be independent in home exercise program to improve strength/mobility for better functional independence with ADLs. Baseline:initiated; 4/28: pt performing HEP, to be advanced Goal status: IN PROGRESS   LONG TERM GOALS: Target date: 03/23/2024    Patient will decrease Quick DASH score by > 8 points demonstrating reduced self-reported upper extremity disability. Baseline: 61: 4/28: 70.45%  Goal status: IN PROGRESS  2.  Patient will improve R shoulder AROM to > 140 degrees of flexion, scaption, and abduction for improved ability to perform overhead activities. Baseline: flex/abd 64 deg/62 deg and pain limited; 01/31/24: flex 80 deg, abd 65 deg, scaption 50 deg Goal status: IN PROGRESS  3.  Patient will  reports a worst pain no greater than 4/10 in the past two weeks to indicate QOL improvement. Baseline: 6/10; 01/31/24: 4/10 but has modified activities to reduce increased pain Goal status: MET  4.  Patient will increase RUE gross strength to 4+/5 as to improve functional strength for increased ADL ability. Baseline: RUE strength grossly 4-/5; 01/31/24: pain limited grossly 4-/5  Goal status: IN PROGRESS     PLAN:  PT FREQUENCY: 1-2x/week  PT DURATION: 12 weeks  PLANNED INTERVENTIONS: 97164- PT Re-evaluation, 97110-Therapeutic exercises, 97530- Therapeutic activity, 97112- Neuromuscular re-education, 97535- Self Care, 95621- Manual therapy, [phone]- Gait training, [phone]- Orthotic Fit/training, [phone]- Splinting, Patient/Family education, Balance training, Stair training, Taping, Dry Needling, Joint mobilization, Spinal mobilization, Scar mobilization, DME instructions, Cryotherapy, and Moist heat.  PLAN FOR NEXT SESSION:  manual, PROM, isometrics. AROM as tolerated.  STM for pain management in R shoulder/cspine.   Samie Crews, PT 02/07/2024, 11:47 AM

## 2024-02-09 ENCOUNTER — Ambulatory Visit

## 2024-02-09 DIAGNOSIS — G8929 Other chronic pain: Secondary | ICD-10-CM

## 2024-02-09 DIAGNOSIS — M6281 Muscle weakness (generalized): Secondary | ICD-10-CM | POA: Diagnosis not present

## 2024-02-09 DIAGNOSIS — M25611 Stiffness of right shoulder, not elsewhere classified: Secondary | ICD-10-CM

## 2024-02-09 NOTE — Therapy (Unsigned)
 OUTPATIENT PHYSICAL THERAPY TREATMENT  Patient Name: Jacqueline Steele MRN: [id number] DOB:[birth year], 63 y.o., female Today's Date: 02/10/2024  END OF SESSION:  PT End of Session - 02/09/24 0855     Visit Number 13    Number of Visits 25    Date for PT Re-Evaluation 03/23/24    PT Start Time 0850    PT Stop Time 0929    PT Time Calculation (min) 39 min    Activity Tolerance Patient limited by pain    Behavior During Therapy [facility] for tasks assessed/performed                   History reviewed. No pertinent past medical history. Past Surgical History:  Procedure Laterality Date   TEE WITHOUT CARDIOVERSION N/A 05/29/2022   Procedure: TRANSESOPHAGEAL ECHOCARDIOGRAM (TEE);  Surgeon: Devorah Fonder, MD;  Location: [facility] ORS;  Service: Cardiovascular;  Laterality: N/A;   TUBAL LIGATION     Patient Active Problem List   Diagnosis Date Noted   Acute stroke due to ischemia (HCC) 05/27/2022   Obesity (BMI 30-39.9) 05/27/2022    PCP: Osie Bleacher, Cleveland Dales, MD  REFERRING PROVIDER: Osie Bleacher, [place] Dales, MD  REFERRING DIAG:  Diagnosis  S46.011A (ICD-10-CM) - Strain of tendon of right rotator cuff    Rationale for Evaluation and Treatment: Rehabilitation  THERAPY DIAG:  Stiffness of right shoulder, not elsewhere classified  Chronic right shoulder pain  Muscle weakness (generalized)  ONSET DATE: 05/27/2022   SUBJECTIVE:                                                                                                                                                                                           SUBJECTIVE STATEMENT:   Pt reports some soreness since last visit. Reports pain is 4/10 currently. Pain mostly on R side   PERTINENT HISTORY:  From EVAL=The pt is pleasant 63 y/o female referred to PT for strain of tendon of R RTC that developed following CVA 05/2022. Pt CVA affected R side and that she did not receive therapy for her RUE following stroke. R side is dominant  side. She cannot raise her R arm, can't lift anything due to pain. General movement hurts.  She now takes extra time to get dressed and often uses her LUE to completed ADLs. She is doing everything by herself still, but it takes longer. Pt reports she is now on disability. Pt reports worst pain is a 6/10. If she sleeps she thinks pain may get to a 0, but otherwise notices it at all times. She describes pain as a burning sensation, "like it is being ripped."  Pt unsure of her fine motor control R hand since she is not using it much  PMH per chart: CVA, HTN, preDM, sleep apnea, bipolar disorder, R hip pain, greater trochanteric pain syndrome or RLE, primary OA of R hip  PAIN:  Are you having pain? RUE pain, worst pain is 6/10  PRECAUTIONS:  None  RED FLAGS: Does report bladder changes since stroke (increased frequency); PT advised pt to discuss with her physician, pt verbalized understanding   WEIGHT BEARING RESTRICTIONS:  No  FALLS:  Has patient fallen in last 6 months? No  LIVING ENVIRONMENT: Lives with: lives alone Lives in: House/apartment Stairs: 4 steps with handrails  Has following equipment at home: Otho Blitz - 2 wheeled, reports not using any assistive devices   OCCUPATION:  On disability   PLOF:  Independent  PATIENT GOALS:  She'd like to be able to use her RUE, does not want to fully lose use of it.    OBJECTIVE:  Note: Objective measures were completed at Evaluation unless otherwise noted.  DIAGNOSTIC FINDINGS:  No pertinent imaging available in chart  PATIENT SURVEYS:  Quick Dash 61  COGNITIVE STATUS: Within functional limits for tasks assessed   SENSATION: Pt can feel a numbness/burning in her R arm Pt sensation intact to light touch BUE   Coordination: WFL rapid alt UE and chin to target  EDEMA:  Pt reports no swelling  POSTURE:  rounded shoulders, slight increase thoracic kyphosis   UE MMT: LUE: 4 to 4+/5 and no pain RUE:  4-/5 * very pain  limited flexion, abduction and IR; ER is only mm group not painful  Grip strength LUE 4/5 RUE 4+/5   UE AROM:  LUE: Flexion 160 deg Abduction 126 deg IR [facility]  ER needs formally assessment appears limited  RUE: Flexion 64 deg Abduction 62 deg IR [facility] ER needs formal assessment appears limited    HAND DOMINANCE:  Right                                                                                                                             TREATMENT DATE: 02/10/24   TE: Nustep UE/LE x 5 min x lvl 1 -- pt rates medium, no pain with intervention R.tband ER 3x10 with 3 sec hold per rep bilat R. Tband standing row 2x10 bilat  Manual:  STM and TrP release to R UT, anterior delt, bicep and R posterior shoulder mm and cervical paraspinals. Additional cuing into long duration R cervical rotation stretch while providing manual. Continued TrPs R side Several reps PROM R shoulder flexion/ext/ER/IR. Continues to exhibit improved flexion, consistently over 90 deg now. Heat donned throughout to R shoulder while PT provided manual therapy, no adverse reaction to heat, skin WNL prior to and upon removal of heat  Updated and reviewed in session (pt additionally completed reps, see above): Access Code: [email or web address] URL: [email or web address] Date: 02/09/2024 Prepared by: Aminta Kales  Exercises - Shoulder External Rotation and  Scapular Retraction with Resistance  - 1 x daily - 4-5 x weekly - 3 sets - 10 reps - 3 second hold/rep hold - Standing Shoulder Row with Anchored Resistance  - 1 x daily - 4-5 x weekly - 2 sets - 10 reps   PATIENT EDUCATION:  Education details: exercise technique,  Person educated: Patient Education method: Explanation, Demonstration, and Verbal cues Education comprehension: verbalized understanding and returned demonstration  HOME EXERCISE PROGRAM: Access Code: [email or web address] URL: [email or web address] Date: 02/09/2024 Prepared by:  Aminta Kales  Exercises - Shoulder External Rotation and Scapular Retraction with Resistance  - 1 x daily - 4-5 x weekly - 3 sets - 10 reps - 3 second hold/rep hold - Standing Shoulder Row with Anchored Resistance  - 1 x daily - 4-5 x weekly - 2 sets - 10 reps  Access Code: [email or web address] URL: [email or web address] Date: 01/18/2024 Prepared by: Aurora Lees  Exercises - Putty Squeezes  - 2 x daily - 5-7 x weekly - 2 sets - 1 reps - 60 sec  hold - Finger Lumbricals with Putty  - 2 x daily - 5-7 x weekly - 2 sets - 1 reps - 60 sec hold - Seated Scapular Retraction  - 1 x daily - 7 x weekly - 2 sets - 10 reps - Seated Shoulder Shrug Circles AROM Forward  - 1 x daily - 7 x weekly - 2 sets - 10 reps - Isometric Shoulder Extension at Wall  - 1 x daily - 7 x weekly - 3 sets - 10 reps - Standing Isometric Shoulder Flexion with Doorway - Arm Bent  - 1 x daily - 7 x weekly - 3 sets - 10 reps   ASSESSMENT:  CLINICAL IMPRESSION: Pt observed to consistently achieve >90 deg PROM R shoulder flexion without pain, although still not WNL. HEP updated to add progressive gentle strengthening for UE to home program. Will review with pt future visit. Pt will continue to benefit from skilled physical therapy intervention to address impairments, improve QOL, and attain therapy goals.    OBJECTIVE IMPAIRMENTS: Abnormal gait, decreased activity tolerance, decreased mobility, decreased ROM, decreased strength, hypomobility, impaired flexibility, impaired sensation, impaired UE functional use, improper body mechanics, postural dysfunction, and pain.   ACTIVITY LIMITATIONS: carrying, lifting, bathing, dressing, reach over head, hygiene/grooming, and locomotion level  PARTICIPATION LIMITATIONS: meal prep, cleaning, shopping, community activity, occupation, and yard work  PERSONAL FACTORS: Age, Sex, Time since onset of injury/illness/exacerbation, and 3+ comorbidities: PMH per chart: CVA, HTN, preDM, sleep  apnea, bipolar disorder, R hip pain, greater trochanteric pain syndrome or RLE, primary OA of R hip are also affecting patient's functional outcome.   REHAB POTENTIAL: Good  CLINICAL DECISION MAKING: Evolving/moderate complexity  EVALUATION COMPLEXITY: Moderate   GOALS: Goals reviewed with patient? Yes   SHORT TERM GOALS: Target date: 02/10/2024    Patient will be independent in home exercise program to improve strength/mobility for better functional independence with ADLs. Baseline:initiated; 4/28: pt performing HEP, to be advanced Goal status: IN PROGRESS   LONG TERM GOALS: Target date: 03/23/2024    Patient will decrease Quick DASH score by > 8 points demonstrating reduced self-reported upper extremity disability. Baseline: 61: 4/28: 70.45%  Goal status: IN PROGRESS  2.  Patient will improve R shoulder AROM to > 140 degrees of flexion, scaption, and abduction for improved ability to perform overhead activities. Baseline: flex/abd 64 deg/62 deg and pain limited; 01/31/24: flex 80 deg, abd 65 deg, scaption 50 deg Goal status: IN PROGRESS  3.  Patient will reports a worst pain no greater than 4/10 in the past two weeks to indicate QOL improvement. Baseline: 6/10; 01/31/24: 4/10 but has modified activities to reduce increased pain Goal status: MET  4.  Patient will increase RUE gross strength to 4+/5 as to improve functional strength for increased ADL ability. Baseline: RUE strength grossly 4-/5; 01/31/24: pain limited grossly 4-/5  Goal status: IN PROGRESS     PLAN:  PT FREQUENCY: 1-2x/week  PT DURATION: 12 weeks  PLANNED INTERVENTIONS: 97164- PT Re-evaluation, 97110-Therapeutic exercises, 97530- Therapeutic activity, 97112- Neuromuscular re-education, 97535- Self Care, 16109- Manual therapy, [phone]- Gait training, [phone]- Orthotic Fit/training, [phone]- Splinting, Patient/Family education, Balance training, Stair training, Taping, Dry Needling, Joint mobilization, Spinal  mobilization, Scar mobilization, DME instructions, Cryotherapy, and Moist heat.  PLAN FOR NEXT SESSION:  manual, PROM, isometrics. AROM as tolerated.  STM for pain management in R shoulder/cspine.   Samie Crews, PT 02/10/2024, 9:02 AM

## 2024-02-10 ENCOUNTER — Encounter: Admitting: Physical Therapy

## 2024-02-14 ENCOUNTER — Ambulatory Visit

## 2024-02-14 DIAGNOSIS — M25611 Stiffness of right shoulder, not elsewhere classified: Secondary | ICD-10-CM

## 2024-02-14 DIAGNOSIS — G8929 Other chronic pain: Secondary | ICD-10-CM

## 2024-02-14 DIAGNOSIS — M6281 Muscle weakness (generalized): Secondary | ICD-10-CM | POA: Diagnosis not present

## 2024-02-14 NOTE — Therapy (Signed)
 OUTPATIENT PHYSICAL THERAPY TREATMENT  Patient Name: Encarnacion Burri MRN: [id number] DOB:Feb 21, 1961, 63 y.o., female Today's Date: 02/14/2024  END OF SESSION:  PT End of Session - 02/14/24 1326     Visit Number 14    Number of Visits 25    Date for PT Re-Evaluation 03/23/24    PT Start Time 0935    PT Stop Time 1015    PT Time Calculation (min) 40 min    Activity Tolerance Patient limited by pain    Behavior During Therapy [facility] for tasks assessed/performed                    History reviewed. No pertinent past medical history. Past Surgical History:  Procedure Laterality Date   TEE WITHOUT CARDIOVERSION N/A 05/29/2022   Procedure: TRANSESOPHAGEAL ECHOCARDIOGRAM (TEE);  Surgeon: Devorah Fonder, MD;  Location: [facility] ORS;  Service: Cardiovascular;  Laterality: N/A;   TUBAL LIGATION     Patient Active Problem List   Diagnosis Date Noted   Acute stroke due to ischemia (HCC) 05/27/2022   Obesity (BMI 30-39.9) 05/27/2022    PCP: Osie Bleacher, Cleveland Dales, MD  REFERRING PROVIDER: Osie Bleacher, Cleveland Dales, MD  REFERRING DIAG:  Diagnosis  S46.011A (ICD-10-CM) - Strain of tendon of right rotator cuff    Rationale for Evaluation and Treatment: Rehabilitation  THERAPY DIAG:  Chronic right shoulder pain  Stiffness of right shoulder, not elsewhere classified  Muscle weakness (generalized)  ONSET DATE: 05/27/2022   SUBJECTIVE:                                                                                                                                                                                           SUBJECTIVE STATEMENT:   Pt reports moderate RUE/R shoulder pain that she feels when she moves it. Pt with R hip pain today.   PERTINENT HISTORY:  From EVAL=The pt is pleasant 63 y/o female referred to PT for strain of tendon of R RTC that developed following CVA 05/2022. Pt CVA affected R side and that she did not receive therapy for her RUE following stroke. R side is  dominant side. She cannot raise her R arm, can't lift anything due to pain. General movement hurts.  She now takes extra time to get dressed and often uses her LUE to completed ADLs. She is doing everything by herself still, but it takes longer. Pt reports she is now on disability. Pt reports worst pain is a 6/10. If she sleeps she thinks pain may get to a 0, but otherwise notices it at all times. She describes pain as a burning sensation, "like it is  being ripped."  Pt unsure of her fine motor control R hand since she is not using it much  PMH per chart: CVA, HTN, preDM, sleep apnea, bipolar disorder, R hip pain, greater trochanteric pain syndrome or RLE, primary OA of R hip  PAIN:  Are you having pain? RUE pain, worst pain is 6/10  PRECAUTIONS:  None  RED FLAGS: Does report bladder changes since stroke (increased frequency); PT advised pt to discuss with her physician, pt verbalized understanding   WEIGHT BEARING RESTRICTIONS:  No  FALLS:  Has patient fallen in last 6 months? No  LIVING ENVIRONMENT: Lives with: lives alone Lives in: House/apartment Stairs: 4 steps with handrails  Has following equipment at home: Otho Blitz - 2 wheeled, reports not using any assistive devices   OCCUPATION:  On disability   PLOF:  Independent  PATIENT GOALS:  She'd like to be able to use her RUE, does not want to fully lose use of it.    OBJECTIVE:  Note: Objective measures were completed at Evaluation unless otherwise noted.  DIAGNOSTIC FINDINGS:  No pertinent imaging available in chart  PATIENT SURVEYS:  Quick Dash 61  COGNITIVE STATUS: Within functional limits for tasks assessed   SENSATION: Pt can feel a numbness/burning in her R arm Pt sensation intact to light touch BUE   Coordination: WFL rapid alt UE and chin to target  EDEMA:  Pt reports no swelling  POSTURE:  rounded shoulders, slight increase thoracic kyphosis   UE MMT: LUE: 4 to 4+/5 and no pain RUE:  4-/5 * very  pain limited flexion, abduction and IR; ER is only mm group not painful  Grip strength LUE 4/5 RUE 4+/5   UE AROM:  LUE: Flexion 160 deg Abduction 126 deg IR [facility]  ER needs formally assessment appears limited  RUE: Flexion 64 deg Abduction 62 deg IR [facility] ER needs formal assessment appears limited    HAND DOMINANCE:  Right                                                                                                                             TREATMENT DATE: 02/14/24   TE:  RUE Pendulum CW/CC, FWD/BCK, LTL R & L 10x each  Standing scapular squeezes 2x12 bilat Standing R shoulder shrug with 3# dumbbell 2x10 R.tband ER, standing 3x12  bilat R. Tband standing row 3x12 bilat  R. Tband shoulder ext bilat 2x10 bilat Wall ladder with RUE flex/abd 1x for each -improved   Manual: on plinth STM and TrP release to R UT, anterior delt, bicep and R posterior shoulder mm and cervical paraspinals. Several reps PROM R shoulder flexion/ext/ER/IR. Pt continues to exhibit improved flexion and abduction Heat donned to R shoulder and R hip while PT provided manual therapy, no adverse reaction to heat, pt reports nothing donned/applied to R shoulder or R hip that would prevent safe use of heat. She reports heat helps pain   PATIENT EDUCATION:  Education  details: exercise technique Person educated: Patient Education method: Explanation, Demonstration, and Verbal cues Education comprehension: verbalized understanding and returned demonstration  HOME EXERCISE PROGRAM: Access Code: [email or web address] URL: [email or web address] Date: 02/09/2024 Prepared by: Aminta Kales  Exercises - Shoulder External Rotation and Scapular Retraction with Resistance  - 1 x daily - 4-5 x weekly - 3 sets - 10 reps - 3 second hold/rep hold - Standing Shoulder Row with Anchored Resistance  - 1 x daily - 4-5 x weekly - 2 sets - 10 reps  Access Code: [email or web address] URL:  [email or web address] Date: 01/18/2024 Prepared by: Aurora Lees  Exercises - Putty Squeezes  - 2 x daily - 5-7 x weekly - 2 sets - 1 reps - 60 sec  hold - Finger Lumbricals with Putty  - 2 x daily - 5-7 x weekly - 2 sets - 1 reps - 60 sec hold - Seated Scapular Retraction  - 1 x daily - 7 x weekly - 2 sets - 10 reps - Seated Shoulder Shrug Circles AROM Forward  - 1 x daily - 7 x weekly - 2 sets - 10 reps - Isometric Shoulder Extension at Wall  - 1 x daily - 7 x weekly - 3 sets - 10 reps - Standing Isometric Shoulder Flexion with Doorway - Arm Bent  - 1 x daily - 7 x weekly - 3 sets - 10 reps   ASSESSMENT:  CLINICAL IMPRESSION: Pt able to advance to complete more therex interventions and more challenging interventions. She tolerated these well in session and PT followed this up with manual therapy. Pt consistently demonstrating increased R shoulder flexion and abduction, although still pain-limited. Pt will continue to benefit from skilled physical therapy intervention to address impairments, improve QOL, and attain therapy goals.    OBJECTIVE IMPAIRMENTS: Abnormal gait, decreased activity tolerance, decreased mobility, decreased ROM, decreased strength, hypomobility, impaired flexibility, impaired sensation, impaired UE functional use, improper body mechanics, postural dysfunction, and pain.   ACTIVITY LIMITATIONS: carrying, lifting, bathing, dressing, reach over head, hygiene/grooming, and locomotion level  PARTICIPATION LIMITATIONS: meal prep, cleaning, shopping, community activity, occupation, and yard work  PERSONAL FACTORS: Age, Sex, Time since onset of injury/illness/exacerbation, and 3+ comorbidities: PMH per chart: CVA, HTN, preDM, sleep apnea, bipolar disorder, R hip pain, greater trochanteric pain syndrome or RLE, primary OA of R hip are also affecting patient's functional outcome.   REHAB POTENTIAL: Good  CLINICAL DECISION MAKING: Evolving/moderate  complexity  EVALUATION COMPLEXITY: Moderate   GOALS: Goals reviewed with patient? Yes   SHORT TERM GOALS: Target date: 02/10/2024    Patient will be independent in home exercise program to improve strength/mobility for better functional independence with ADLs. Baseline:initiated; 4/28: pt performing HEP, to be advanced Goal status: IN PROGRESS   LONG TERM GOALS: Target date: 03/23/2024    Patient will decrease Quick DASH score by > 8 points demonstrating reduced self-reported upper extremity disability. Baseline: 61: 4/28: 70.45%  Goal status: IN PROGRESS  2.  Patient will improve R shoulder AROM to > 140 degrees of flexion, scaption, and abduction for improved ability to perform overhead activities. Baseline: flex/abd 64 deg/62 deg and pain limited; 01/31/24: flex 80 deg, abd 65 deg, scaption 50 deg Goal status: IN PROGRESS  3.  Patient will reports a worst pain no greater than 4/10 in the past two weeks to indicate QOL improvement. Baseline: 6/10; 01/31/24: 4/10 but has modified activities to reduce increased pain Goal status: MET  4.  Patient will increase RUE gross strength to 4+/5 as  to improve functional strength for increased ADL ability. Baseline: RUE strength grossly 4-/5; 01/31/24: pain limited grossly 4-/5  Goal status: IN PROGRESS     PLAN:  PT FREQUENCY: 1-2x/week  PT DURATION: 12 weeks  PLANNED INTERVENTIONS: 97164- PT Re-evaluation, 97110-Therapeutic exercises, 97530- Therapeutic activity, 97112- Neuromuscular re-education, 97535- Self Care, 16109- Manual therapy, [phone]- Gait training, [phone]- Orthotic Fit/training, [phone]- Splinting, Patient/Family education, Balance training, Stair training, Taping, Dry Needling, Joint mobilization, Spinal mobilization, Scar mobilization, DME instructions, Cryotherapy, and Moist heat.  PLAN FOR NEXT SESSION:  manual, PROM, isometrics. AROM as tolerated.  STM for pain management in R shoulder/cspine.   Samie Crews,  PT 02/14/2024, 1:27 PM

## 2024-02-16 ENCOUNTER — Ambulatory Visit: Admitting: Physical Therapy

## 2024-02-16 DIAGNOSIS — G8929 Other chronic pain: Secondary | ICD-10-CM

## 2024-02-16 DIAGNOSIS — M6281 Muscle weakness (generalized): Secondary | ICD-10-CM | POA: Diagnosis not present

## 2024-02-16 DIAGNOSIS — M25611 Stiffness of right shoulder, not elsewhere classified: Secondary | ICD-10-CM

## 2024-02-16 NOTE — Therapy (Signed)
 OUTPATIENT PHYSICAL THERAPY TREATMENT  Patient Name: Jacqueline Steele MRN: [id number] DOB:June 25, 1961, 63 y.o., female Today's Date: 02/16/2024  END OF SESSION:  PT End of Session - 02/16/24 0821     Visit Number 15    Number of Visits 25    Date for PT Re-Evaluation 03/23/24    Progress Note Due on Visit 20    PT Start Time 0845    PT Stop Time 0925    PT Time Calculation (min) 40 min    Activity Tolerance Patient limited by pain    Behavior During Therapy [facility] for tasks assessed/performed                    No past medical history on file. Past Surgical History:  Procedure Laterality Date   TEE WITHOUT CARDIOVERSION N/A 05/29/2022   Procedure: TRANSESOPHAGEAL ECHOCARDIOGRAM (TEE);  Surgeon: Devorah Fonder, MD;  Location: [facility] ORS;  Service: Cardiovascular;  Laterality: N/A;   TUBAL LIGATION     Patient Active Problem List   Diagnosis Date Noted   Acute stroke due to ischemia (HCC) 05/27/2022   Obesity (BMI 30-39.9) 05/27/2022    PCP: Kathrynn Park, MD  REFERRING PROVIDER: Osie Bleacher, Cleveland Dales, MD  REFERRING DIAG:  Diagnosis  S46.011A (ICD-10-CM) - Strain of tendon of right rotator cuff    Rationale for Evaluation and Treatment: Rehabilitation  THERAPY DIAG:  No diagnosis found.  ONSET DATE: 05/27/2022   SUBJECTIVE:                                                                                                                                                                                           SUBJECTIVE STATEMENT:   Pt reports her shoulder is feeling good today, still having some hip discomfort on the R side.   PERTINENT HISTORY:  From EVAL=The pt is pleasant 63 y/o female referred to PT for strain of tendon of R RTC that developed following CVA 05/2022. Pt CVA affected R side and that she did not receive therapy for her RUE following stroke. R side is dominant side. She cannot raise her R arm, can't lift anything due to pain. General movement  hurts.  She now takes extra time to get dressed and often uses her LUE to completed ADLs. She is doing everything by herself still, but it takes longer. Pt reports she is now on disability. Pt reports worst pain is a 6/10. If she sleeps she thinks pain may get to a 0, but otherwise notices it at all times. She describes pain as a burning sensation, "like it is being ripped."  Pt unsure of her  fine motor control R hand since she is not using it much  PMH per chart: CVA, HTN, preDM, sleep apnea, bipolar disorder, R hip pain, greater trochanteric pain syndrome or RLE, primary OA of R hip  PAIN:  Are you having pain? RUE pain, worst pain is 6/10  PRECAUTIONS:  None  RED FLAGS: Does report bladder changes since stroke (increased frequency); PT advised pt to discuss with her physician, pt verbalized understanding   WEIGHT BEARING RESTRICTIONS:  No  FALLS:  Has patient fallen in last 6 months? No  LIVING ENVIRONMENT: Lives with: lives alone Lives in: House/apartment Stairs: 4 steps with handrails  Has following equipment at home: Otho Blitz - 2 wheeled, reports not using any assistive devices   OCCUPATION:  On disability   PLOF:  Independent  PATIENT GOALS:  She'd like to be able to use her RUE, does not want to fully lose use of it.    OBJECTIVE:  Note: Objective measures were completed at Evaluation unless otherwise noted.  DIAGNOSTIC FINDINGS:  No pertinent imaging available in chart  PATIENT SURVEYS:  Quick Dash 61  COGNITIVE STATUS: Within functional limits for tasks assessed   SENSATION: Pt can feel a numbness/burning in her R arm Pt sensation intact to light touch BUE   Coordination: WFL rapid alt UE and chin to target  EDEMA:  Pt reports no swelling  POSTURE:  rounded shoulders, slight increase thoracic kyphosis   UE MMT: LUE: 4 to 4+/5 and no pain RUE:  4-/5 * very pain limited flexion, abduction and IR; ER is only mm group not painful  Grip  strength LUE 4/5 RUE 4+/5   UE AROM:  LUE: Flexion 160 deg Abduction 126 deg IR [facility]  ER needs formally assessment appears limited  RUE: Flexion 64 deg Abduction 62 deg IR [facility] ER needs formal assessment appears limited    HAND DOMINANCE:  Right                                                                                                                             TREATMENT DATE: 02/16/24   TE:  Sci fit trainer level 4 resistance x 3 min ea direction  R.tband ER, standing 3x12  bilat R. Tband standing row 3x12 bilat  R. Tband shoulder ext bilat 2x10 bilat Wall ladder with RUE flex/abd 2x for each  Several reps PROM R shoulder flexion/ext/ER/IR. Pt continues to exhibit improved flexion and abduction.   Manual: on plinth STM and TrP release to R UT, anterior delt, bicep and R posterior shoulder mm and cervical paraspinals.    PATIENT EDUCATION:  Education details: exercise technique Person educated: Patient Education method: Explanation, Demonstration, and Verbal cues Education comprehension: verbalized understanding and returned demonstration  HOME EXERCISE PROGRAM: Access Code: [email or web address] URL: [email or web address] Date: 02/09/2024 Prepared by: Aminta Kales  Exercises - Shoulder External Rotation and Scapular Retraction with Resistance  - 1 x daily - 4-5 x weekly - 3 sets -  10 reps - 3 second hold/rep hold - Standing Shoulder Row with Anchored Resistance  - 1 x daily - 4-5 x weekly - 2 sets - 10 reps  Access Code: [email or web address] URL: [email or web address] Date: 01/18/2024 Prepared by: Aurora Lees  Exercises - Putty Squeezes  - 2 x daily - 5-7 x weekly - 2 sets - 1 reps - 60 sec  hold - Finger Lumbricals with Putty  - 2 x daily - 5-7 x weekly - 2 sets - 1 reps - 60 sec hold - Seated Scapular Retraction  - 1 x daily - 7 x weekly - 2 sets - 10 reps - Seated Shoulder Shrug Circles AROM Forward  - 1 x daily - 7 x weekly - 2 sets - 10  reps - Isometric Shoulder Extension at Wall  - 1 x daily - 7 x weekly - 3 sets - 10 reps - Standing Isometric Shoulder Flexion with Doorway - Arm Bent  - 1 x daily - 7 x weekly - 3 sets - 10 reps   ASSESSMENT:  CLINICAL IMPRESSION:  Patient arrived with good motivation for completion of pt activities.   She tolerated these well in session and PT followed this up with manual therapy.Pt shows progress with both shoulder pain, mobility and function, however, is still lacking strength for functional activities. Pt will continue to benefit from skilled physical therapy intervention to address impairments, improve QOL, and attain therapy goals.    OBJECTIVE IMPAIRMENTS: Abnormal gait, decreased activity tolerance, decreased mobility, decreased ROM, decreased strength, hypomobility, impaired flexibility, impaired sensation, impaired UE functional use, improper body mechanics, postural dysfunction, and pain.   ACTIVITY LIMITATIONS: carrying, lifting, bathing, dressing, reach over head, hygiene/grooming, and locomotion level  PARTICIPATION LIMITATIONS: meal prep, cleaning, shopping, community activity, occupation, and yard work  PERSONAL FACTORS: Age, Sex, Time since onset of injury/illness/exacerbation, and 3+ comorbidities: PMH per chart: CVA, HTN, preDM, sleep apnea, bipolar disorder, R hip pain, greater trochanteric pain syndrome or RLE, primary OA of R hip are also affecting patient's functional outcome.   REHAB POTENTIAL: Good  CLINICAL DECISION MAKING: Evolving/moderate complexity  EVALUATION COMPLEXITY: Moderate   GOALS: Goals reviewed with patient? Yes   SHORT TERM GOALS: Target date: 02/10/2024    Patient will be independent in home exercise program to improve strength/mobility for better functional independence with ADLs. Baseline:initiated; 4/28: pt performing HEP, to be advanced Goal status: IN PROGRESS   LONG TERM GOALS: Target date: 03/23/2024    Patient will decrease  Quick DASH score by > 8 points demonstrating reduced self-reported upper extremity disability. Baseline: 61: 4/28: 70.45%  Goal status: IN PROGRESS  2.  Patient will improve R shoulder AROM to > 140 degrees of flexion, scaption, and abduction for improved ability to perform overhead activities. Baseline: flex/abd 64 deg/62 deg and pain limited; 01/31/24: flex 80 deg, abd 65 deg, scaption 50 deg Goal status: IN PROGRESS  3.  Patient will reports a worst pain no greater than 4/10 in the past two weeks to indicate QOL improvement. Baseline: 6/10; 01/31/24: 4/10 but has modified activities to reduce increased pain Goal status: MET  4.  Patient will increase RUE gross strength to 4+/5 as to improve functional strength for increased ADL ability. Baseline: RUE strength grossly 4-/5; 01/31/24: pain limited grossly 4-/5  Goal status: IN PROGRESS     PLAN:  PT FREQUENCY: 1-2x/week  PT DURATION: 12 weeks  PLANNED INTERVENTIONS: 97164- PT Re-evaluation, 97110-Therapeutic exercises, 97530- Therapeutic activity, [id number]- Neuromuscular re-education, 97535- Self Care,  16109- Manual therapy, [id number]- Gait training, [id number]- Orthotic Fit/training, 09811- Splinting, Patient/Family education, Balance training, Stair training, Taping, Dry Needling, Joint mobilization, Spinal mobilization, Scar mobilization, DME instructions, Cryotherapy, and Moist heat.  PLAN FOR NEXT SESSION:  manual, PROM, isometrics. AROM as tolerated.  STM for pain management in R shoulder/cspine.   Edwina Gram, PT 02/16/2024, 8:26 AM

## 2024-02-22 ENCOUNTER — Encounter

## 2024-02-24 ENCOUNTER — Encounter: Payer: Self-pay | Admitting: Physical Therapy

## 2024-02-24 ENCOUNTER — Ambulatory Visit: Admitting: Physical Therapy

## 2024-02-24 DIAGNOSIS — M6281 Muscle weakness (generalized): Secondary | ICD-10-CM

## 2024-02-24 DIAGNOSIS — G8929 Other chronic pain: Secondary | ICD-10-CM

## 2024-02-24 DIAGNOSIS — M25611 Stiffness of right shoulder, not elsewhere classified: Secondary | ICD-10-CM

## 2024-02-24 NOTE — Therapy (Signed)
 OUTPATIENT PHYSICAL THERAPY TREATMENT  Patient Name: Jacqueline Steele MRN: [id number] DOB:1961/07/21, 63 y.o., female Today's Date: 02/24/2024  END OF SESSION:  PT End of Session - 02/24/24 0850     Visit Number 16    Number of Visits 25    Date for PT Re-Evaluation 03/23/24    Progress Note Due on Visit 20    PT Start Time 0847    PT Stop Time 0927    PT Time Calculation (min) 40 min    Activity Tolerance Patient limited by pain    Behavior During Therapy [facility] for tasks assessed/performed                    History reviewed. No pertinent past medical history. Past Surgical History:  Procedure Laterality Date   TEE WITHOUT CARDIOVERSION N/A 05/29/2022   Procedure: TRANSESOPHAGEAL ECHOCARDIOGRAM (TEE);  Surgeon: Devorah Fonder, MD;  Location: [facility] ORS;  Service: Cardiovascular;  Laterality: N/A;   TUBAL LIGATION     Patient Active Problem List   Diagnosis Date Noted   Acute stroke due to ischemia (HCC) 05/27/2022   Obesity (BMI 30-39.9) 05/27/2022    PCP: Kathrynn Park, MD  REFERRING PROVIDER: Osie Bleacher, Cleveland Dales, MD  REFERRING DIAG:  Diagnosis  S46.011A (ICD-10-CM) - Strain of tendon of right rotator cuff    Rationale for Evaluation and Treatment: Rehabilitation  THERAPY DIAG:  Chronic right shoulder pain  Stiffness of right shoulder, not elsewhere classified  Muscle weakness (generalized)  ONSET DATE: 05/27/2022   SUBJECTIVE:                                                                                                                                                                                           SUBJECTIVE STATEMENT:   Pt reports her shoulder is feeling good today, states that shoulder is not hurting this AM. Reports that she had a good night sleep yesterday.    PERTINENT HISTORY:  From EVAL=The pt is pleasant 63 y/o female referred to PT for strain of tendon of R RTC that developed following CVA 05/2022. Pt CVA affected R side and  that she did not receive therapy for her RUE following stroke. R side is dominant side. She cannot raise her R arm, can't lift anything due to pain. General movement hurts.  She now takes extra time to get dressed and often uses her LUE to completed ADLs. She is doing everything by herself still, but it takes longer. Pt reports she is now on disability. Pt reports worst pain is a 6/10. If she sleeps she thinks pain may get to a 0, but  otherwise notices it at all times. She describes pain as a burning sensation, "like it is being ripped."  Pt unsure of her fine motor control R hand since she is not using it much  PMH per chart: CVA, HTN, preDM, sleep apnea, bipolar disorder, R hip pain, greater trochanteric pain syndrome or RLE, primary OA of R hip  PAIN:  Are you having pain? RUE pain, worst pain is 6/10  PRECAUTIONS:  None  RED FLAGS: Does report bladder changes since stroke (increased frequency); PT advised pt to discuss with her physician, pt verbalized understanding   WEIGHT BEARING RESTRICTIONS:  No  FALLS:  Has patient fallen in last 6 months? No  LIVING ENVIRONMENT: Lives with: lives alone Lives in: House/apartment Stairs: 4 steps with handrails  Has following equipment at home: Otho Blitz - 2 wheeled, reports not using any assistive devices   OCCUPATION:  On disability   PLOF:  Independent  PATIENT GOALS:  She'd like to be able to use her RUE, does not want to fully lose use of it.    OBJECTIVE:  Note: Objective measures were completed at Evaluation unless otherwise noted.  DIAGNOSTIC FINDINGS:  No pertinent imaging available in chart  PATIENT SURVEYS:  Quick Dash 61  COGNITIVE STATUS: Within functional limits for tasks assessed   SENSATION: Pt can feel a numbness/burning in her R arm Pt sensation intact to light touch BUE   Coordination: WFL rapid alt UE and chin to target  EDEMA:  Pt reports no swelling  POSTURE:  rounded shoulders, slight increase  thoracic kyphosis   UE MMT: LUE: 4 to 4+/5 and no pain RUE:  4-/5 * very pain limited flexion, abduction and IR; ER is only mm group not painful  Grip strength LUE 4/5 RUE 4+/5   UE AROM:  LUE: Flexion 160 deg Abduction 126 deg IR [facility]  ER needs formally assessment appears limited  RUE: Flexion 64 deg Abduction 62 deg IR [facility] ER needs formal assessment appears limited    HAND DOMINANCE:  Right                                                                                                                             TREATMENT DATE: 02/24/24   TE:  Sci fit trainer level 4 resistance x 3 min ea direction  Seated shoulder flexion to slide arm across mat table. 2 x 12 with 3 sec hold at end range R.tband ER, seated 3x12  bilat R. Tband standing row 3x12 bilat  R. Tband shoulder ext bilat 2x10 bilat Shoulder rolls forward x 12, reverse x 12, hold in depressed and retracted position Self AAROM into flexion with cane and tacile cues for improved scapular rhythm. X 8  AAROM with hands together x 8 with tactile cues for scapular movement.  AAROM shoulder abduction, mild pain eccentric and concentric. Cues for scapular movement without reduced pain.    Manual: on plinth STM and TrP release to  R  anterior delt, bicep and R posterior shoulder mm and cervical paraspinals. Heat applied while STM performed for pt comfort and pain management     PATIENT EDUCATION:  Education details: exercise technique Person educated: Patient Education method: Explanation, Demonstration, and Verbal cues Education comprehension: verbalized understanding and returned demonstration  HOME EXERCISE PROGRAM: Access Code: [email or web address] URL: [email or web address] Date: 02/09/2024 Prepared by: Aminta Kales  Exercises - Shoulder External Rotation and Scapular Retraction with Resistance  - 1 x daily - 4-5 x weekly - 3 sets - 10 reps - 3 second hold/rep hold - Standing Shoulder Row with  Anchored Resistance  - 1 x daily - 4-5 x weekly - 2 sets - 10 reps  Access Code: [email or web address] URL: [email or web address] Date: 01/18/2024 Prepared by: Aurora Lees  Exercises - Putty Squeezes  - 2 x daily - 5-7 x weekly - 2 sets - 1 reps - 60 sec  hold - Finger Lumbricals with Putty  - 2 x daily - 5-7 x weekly - 2 sets - 1 reps - 60 sec hold - Seated Scapular Retraction  - 1 x daily - 7 x weekly - 2 sets - 10 reps - Seated Shoulder Shrug Circles AROM Forward  - 1 x daily - 7 x weekly - 2 sets - 10 reps - Isometric Shoulder Extension at Wall  - 1 x daily - 7 x weekly - 3 sets - 10 reps - Standing Isometric Shoulder Flexion with Doorway - Arm Bent  - 1 x daily - 7 x weekly - 3 sets - 10 reps   ASSESSMENT:  CLINICAL IMPRESSION:  Patient arrived with good motivation for completion of pt activities.   She tolerated these well in session and PT followed this up with manual therapy. States that heat is extremely helpful for pain management following therex. Pt continues to show progress with both shoulder pain, mobility and function, however, is still lacking strength for functional activities. Pt will continue to benefit from skilled physical therapy intervention to address impairments, improve QOL, and attain therapy goals.    OBJECTIVE IMPAIRMENTS: Abnormal gait, decreased activity tolerance, decreased mobility, decreased ROM, decreased strength, hypomobility, impaired flexibility, impaired sensation, impaired UE functional use, improper body mechanics, postural dysfunction, and pain.   ACTIVITY LIMITATIONS: carrying, lifting, bathing, dressing, reach over head, hygiene/grooming, and locomotion level  PARTICIPATION LIMITATIONS: meal prep, cleaning, shopping, community activity, occupation, and yard work  PERSONAL FACTORS: Age, Sex, Time since onset of injury/illness/exacerbation, and 3+ comorbidities: PMH per chart: CVA, HTN, preDM, sleep apnea, bipolar disorder, R hip pain,  greater trochanteric pain syndrome or RLE, primary OA of R hip are also affecting patient's functional outcome.   REHAB POTENTIAL: Good  CLINICAL DECISION MAKING: Evolving/moderate complexity  EVALUATION COMPLEXITY: Moderate   GOALS: Goals reviewed with patient? Yes   SHORT TERM GOALS: Target date: 02/10/2024    Patient will be independent in home exercise program to improve strength/mobility for better functional independence with ADLs. Baseline:initiated; 4/28: pt performing HEP, to be advanced Goal status: IN PROGRESS   LONG TERM GOALS: Target date: 03/23/2024    Patient will decrease Quick DASH score by > 8 points demonstrating reduced self-reported upper extremity disability. Baseline: 61: 4/28: 70.45%  Goal status: IN PROGRESS  2.  Patient will improve R shoulder AROM to > 140 degrees of flexion, scaption, and abduction for improved ability to perform overhead activities. Baseline: flex/abd 64 deg/62 deg and pain limited; 01/31/24: flex 80 deg, abd 65 deg, scaption 50 deg Goal status:  IN PROGRESS  3.  Patient will reports a worst pain no greater than 4/10 in the past two weeks to indicate QOL improvement. Baseline: 6/10; 01/31/24: 4/10 but has modified activities to reduce increased pain Goal status: MET  4.  Patient will increase RUE gross strength to 4+/5 as to improve functional strength for increased ADL ability. Baseline: RUE strength grossly 4-/5; 01/31/24: pain limited grossly 4-/5  Goal status: IN PROGRESS     PLAN:  PT FREQUENCY: 1-2x/week  PT DURATION: 12 weeks  PLANNED INTERVENTIONS: 97164- PT Re-evaluation, 97110-Therapeutic exercises, 97530- Therapeutic activity, 97112- Neuromuscular re-education, 97535- Self Care, 40981- Manual therapy, [phone]- Gait training, [phone]- Orthotic Fit/training, [phone]- Splinting, Patient/Family education, Balance training, Stair training, Taping, Dry Needling, Joint mobilization, Spinal mobilization, Scar mobilization, DME  instructions, Cryotherapy, and Moist heat.  PLAN FOR NEXT SESSION:  manual, PROM, isometrics. AROM as tolerated.  STM for pain management in R shoulder/cspine.   Barbara Book, PT 02/24/2024, 8:50 AM

## 2024-02-29 ENCOUNTER — Encounter

## 2024-03-02 ENCOUNTER — Ambulatory Visit: Admitting: Physical Therapy

## 2024-03-02 DIAGNOSIS — M6281 Muscle weakness (generalized): Secondary | ICD-10-CM | POA: Diagnosis not present

## 2024-03-02 DIAGNOSIS — M25611 Stiffness of right shoulder, not elsewhere classified: Secondary | ICD-10-CM

## 2024-03-02 DIAGNOSIS — G8929 Other chronic pain: Secondary | ICD-10-CM

## 2024-03-02 NOTE — Therapy (Signed)
 OUTPATIENT PHYSICAL THERAPY TREATMENT  Patient Name: Jacqueline Steele MRN: [id number] DOB:[birth year], 63 y.o., female Today's Date: 03/02/2024  END OF SESSION:  PT End of Session - 03/02/24 0853     Visit Number 17    Number of Visits 25    Date for PT Re-Evaluation 03/23/24    Progress Note Due on Visit 20    PT Start Time [phone]    PT Stop Time 0930    PT Time Calculation (min) 39 min    Activity Tolerance Patient limited by pain    Behavior During Therapy [facility] for tasks assessed/performed                    No past medical history on file. Past Surgical History:  Procedure Laterality Date   TEE WITHOUT CARDIOVERSION N/A 05/29/2022   Procedure: TRANSESOPHAGEAL ECHOCARDIOGRAM (TEE);  Surgeon: Devorah Fonder, MD;  Location: [facility] ORS;  Service: Cardiovascular;  Laterality: N/A;   TUBAL LIGATION     Patient Active Problem List   Diagnosis Date Noted   Acute stroke due to ischemia (HCC) 05/27/2022   Obesity (BMI 30-39.9) 05/27/2022    PCP: Osie Bleacher, [place] Dales, MD  REFERRING PROVIDER: Osie Bleacher, [place] Dales, MD  REFERRING DIAG:  Diagnosis  S46.011A (ICD-10-CM) - Strain of tendon of right rotator cuff    Rationale for Evaluation and Treatment: Rehabilitation  THERAPY DIAG:  Chronic right shoulder pain  Stiffness of right shoulder, not elsewhere classified  Muscle weakness (generalized)  ONSET DATE: 05/27/2022   SUBJECTIVE:                                                                                                                                                                                           SUBJECTIVE STATEMENT:   Pt reports her shoulder is feeling much better. States that she is moving up up over head again with improved scapular sequencing. Has been able to sleep overnight for several weeks.    PERTINENT HISTORY:  From EVAL=The pt is pleasant 63 y/o female referred to PT for strain of tendon of R RTC that developed following CVA 05/2022. Pt CVA  affected R side and that she did not receive therapy for her RUE following stroke. R side is dominant side. She cannot raise her R arm, can't lift anything due to pain. General movement hurts.  She now takes extra time to get dressed and often uses her LUE to completed ADLs. She is doing everything by herself still, but it takes longer. Pt reports she is now on disability. Pt reports worst pain is a 6/10. If she sleeps she thinks pain may  get to a 0, but otherwise notices it at all times. She describes pain as a burning sensation, "like it is being ripped."  Pt unsure of her fine motor control R hand since she is not using it much  PMH per chart: CVA, HTN, preDM, sleep apnea, bipolar disorder, R hip pain, greater trochanteric pain syndrome or RLE, primary OA of R hip  PAIN:  Are you having pain? RUE pain, worst pain is 6/10  PRECAUTIONS:  None  RED FLAGS: Does report bladder changes since stroke (increased frequency); PT advised pt to discuss with her physician, pt verbalized understanding   WEIGHT BEARING RESTRICTIONS:  No  FALLS:  Has patient fallen in last 6 months? No  LIVING ENVIRONMENT: Lives with: lives alone Lives in: House/apartment Stairs: 4 steps with handrails  Has following equipment at home: Otho Blitz - 2 wheeled, reports not using any assistive devices   OCCUPATION:  On disability   PLOF:  Independent  PATIENT GOALS:  She'd like to be able to use her RUE, does not want to fully lose use of it.    OBJECTIVE:  Note: Objective measures were completed at Evaluation unless otherwise noted.  DIAGNOSTIC FINDINGS:  No pertinent imaging available in chart  PATIENT SURVEYS:  Quick Dash 61  COGNITIVE STATUS: Within functional limits for tasks assessed   SENSATION: Pt can feel a numbness/burning in her R arm Pt sensation intact to light touch BUE   Coordination: WFL rapid alt UE and chin to target  EDEMA:  Pt reports no swelling  POSTURE:  rounded shoulders,  slight increase thoracic kyphosis   UE MMT: LUE: 4 to 4+/5 and no pain RUE:  4-/5 * very pain limited flexion, abduction and IR; ER is only mm group not painful  Grip strength LUE 4/5 RUE 4+/5   UE AROM:  LUE: Flexion 160 deg Abduction 126 deg IR [facility]  ER needs formally assessment appears limited  RUE: Flexion 64 deg Abduction 62 deg IR [facility] ER needs formal assessment appears limited    HAND DOMINANCE:  Right                                                                                                                             TREATMENT DATE: 03/02/24   TE:   Nustepwith BUE/BLE for  BUE AAROM into flexion/extension. No pain  Seated:  Bicep curl with scapular depression 2 x 40 sec 2# DB Shoulder extension with scapular setting 2 x 12 with 3# DB Scapular retraction with slight ER AROM x 10  Standing shoulder circles with small ball on wall. X 30 sec bil  Seated shoulder scaption/abduction x 12 with tactile cues for scapular rhythm to reduce shoulder elevation. Reports no pain on this day!    Manual: on plinth STM and TrP release to R  UT and anterior delt, as well as R posterior shoulder mm 10 min total.  Heat applied while STM performed for pt comfort  and pain management     PATIENT EDUCATION:  Education details: exercise technique Person educated: Patient Education method: Explanation, Demonstration, and Verbal cues Education comprehension: verbalized understanding and returned demonstration  HOME EXERCISE PROGRAM: Access Code: [email or web address] URL: [email or web address] Date: 02/09/2024 Prepared by: Aminta Kales  Exercises - Shoulder External Rotation and Scapular Retraction with Resistance  - 1 x daily - 4-5 x weekly - 3 sets - 10 reps - 3 second hold/rep hold - Standing Shoulder Row with Anchored Resistance  - 1 x daily - 4-5 x weekly - 2 sets - 10 reps  Access Code: [email or web address] URL: [email or web address] Date:  01/18/2024 Prepared by: Aurora Lees  Exercises - Putty Squeezes  - 2 x daily - 5-7 x weekly - 2 sets - 1 reps - 60 sec  hold - Finger Lumbricals with Putty  - 2 x daily - 5-7 x weekly - 2 sets - 1 reps - 60 sec hold - Seated Scapular Retraction  - 1 x daily - 7 x weekly - 2 sets - 10 reps - Seated Shoulder Shrug Circles AROM Forward  - 1 x daily - 7 x weekly - 2 sets - 10 reps - Isometric Shoulder Extension at Wall  - 1 x daily - 7 x weekly - 3 sets - 10 reps - Standing Isometric Shoulder Flexion with Doorway - Arm Bent  - 1 x daily - 7 x weekly - 3 sets - 10 reps   ASSESSMENT:  CLINICAL IMPRESSION:  Patient arrived with good motivation for completion of pt activities.   Increased resistance added with emphasis on UE activation while maintaining scapular setting. No pain with shoulder scaption/abduction on this day and improved scapular rhythm. TP noted in R UT on this day. Tolerated STM well ans reduced decreased tightness in shoulder upon completion.  Pt will continue to benefit from skilled physical therapy intervention to address impairments, improve QOL, and attain therapy goals.    OBJECTIVE IMPAIRMENTS: Abnormal gait, decreased activity tolerance, decreased mobility, decreased ROM, decreased strength, hypomobility, impaired flexibility, impaired sensation, impaired UE functional use, improper body mechanics, postural dysfunction, and pain.   ACTIVITY LIMITATIONS: carrying, lifting, bathing, dressing, reach over head, hygiene/grooming, and locomotion level  PARTICIPATION LIMITATIONS: meal prep, cleaning, shopping, community activity, occupation, and yard work  PERSONAL FACTORS: Age, Sex, Time since onset of injury/illness/exacerbation, and 3+ comorbidities: PMH per chart: CVA, HTN, preDM, sleep apnea, bipolar disorder, R hip pain, greater trochanteric pain syndrome or RLE, primary OA of R hip are also affecting patient's functional outcome.   REHAB POTENTIAL: Good  CLINICAL  DECISION MAKING: Evolving/moderate complexity  EVALUATION COMPLEXITY: Moderate   GOALS: Goals reviewed with patient? Yes   SHORT TERM GOALS: Target date: 02/10/2024    Patient will be independent in home exercise program to improve strength/mobility for better functional independence with ADLs. Baseline:initiated; 4/28: pt performing HEP, to be advanced Goal status: IN PROGRESS   LONG TERM GOALS: Target date: 03/23/2024    Patient will decrease Quick DASH score by > 8 points demonstrating reduced self-reported upper extremity disability. Baseline: 61: 4/28: 70.45%  Goal status: IN PROGRESS  2.  Patient will improve R shoulder AROM to > 140 degrees of flexion, scaption, and abduction for improved ability to perform overhead activities. Baseline: flex/abd 64 deg/62 deg and pain limited; 01/31/24: flex 80 deg, abd 65 deg, scaption 50 deg Goal status: IN PROGRESS  3.  Patient will reports a worst pain no greater than 4/10 in the past two weeks to indicate  QOL improvement. Baseline: 6/10; 01/31/24: 4/10 but has modified activities to reduce increased pain Goal status: MET  4.  Patient will increase RUE gross strength to 4+/5 as to improve functional strength for increased ADL ability. Baseline: RUE strength grossly 4-/5; 01/31/24: pain limited grossly 4-/5  Goal status: IN PROGRESS     PLAN:  PT FREQUENCY: 1-2x/week  PT DURATION: 12 weeks  PLANNED INTERVENTIONS: 97164- PT Re-evaluation, 97110-Therapeutic exercises, 97530- Therapeutic activity, 97112- Neuromuscular re-education, 97535- Self Care, 40981- Manual therapy, [phone]- Gait training, [phone]- Orthotic Fit/training, [phone]- Splinting, Patient/Family education, Balance training, Stair training, Taping, Dry Needling, Joint mobilization, Spinal mobilization, Scar mobilization, DME instructions, Cryotherapy, and Moist heat.  PLAN FOR NEXT SESSION:  manual, PROM, isometrics. AROM as tolerated with resistance. Increase HEP   STM  for pain management in R shoulder/cspine.   Barbara Book, PT 03/02/2024, 8:54 AM

## 2024-03-07 ENCOUNTER — Ambulatory Visit: Attending: Internal Medicine | Admitting: Physical Therapy

## 2024-03-07 DIAGNOSIS — M6281 Muscle weakness (generalized): Secondary | ICD-10-CM | POA: Insufficient documentation

## 2024-03-07 DIAGNOSIS — M25611 Stiffness of right shoulder, not elsewhere classified: Secondary | ICD-10-CM | POA: Diagnosis present

## 2024-03-07 DIAGNOSIS — G8929 Other chronic pain: Secondary | ICD-10-CM | POA: Diagnosis present

## 2024-03-07 DIAGNOSIS — M25511 Pain in right shoulder: Secondary | ICD-10-CM | POA: Insufficient documentation

## 2024-03-07 NOTE — Therapy (Signed)
 OUTPATIENT PHYSICAL THERAPY TREATMENT  Patient Name: Jacqueline Steele MRN: [id number] DOB:[birth year], 63 y.o., female Today's Date: 03/07/2024  END OF SESSION:  PT End of Session - 03/07/24 0853     Visit Number 18    Number of Visits 25    Date for PT Re-Evaluation 03/23/24    Progress Note Due on Visit 20    PT Start Time 0848    PT Stop Time 0927    PT Time Calculation (min) 39 min    Activity Tolerance Patient limited by pain    Behavior During Therapy [facility] for tasks assessed/performed                     No past medical history on file. Past Surgical History:  Procedure Laterality Date   TEE WITHOUT CARDIOVERSION N/A 05/29/2022   Procedure: TRANSESOPHAGEAL ECHOCARDIOGRAM (TEE);  Surgeon: Devorah Fonder, MD;  Location: [facility] ORS;  Service: Cardiovascular;  Laterality: N/A;   TUBAL LIGATION     Patient Active Problem List   Diagnosis Date Noted   Acute stroke due to ischemia (HCC) 05/27/2022   Obesity (BMI 30-39.9) 05/27/2022    PCP: Kathrynn Park, MD  REFERRING PROVIDER: Osie Bleacher, Cleveland Dales, MD  REFERRING DIAG:  Diagnosis  S46.011A (ICD-10-CM) - Strain of tendon of right rotator cuff    Rationale for Evaluation and Treatment: Rehabilitation  THERAPY DIAG:  Chronic right shoulder pain  Stiffness of right shoulder, not elsewhere classified  Muscle weakness (generalized)  ONSET DATE: 05/27/2022   SUBJECTIVE:                                                                                                                                                                                           SUBJECTIVE STATEMENT:   Pt reports overdoing it in the garden yesterday working on and off all day with gardening activities.    PERTINENT HISTORY:  From EVAL=The pt is pleasant 63 y/o female referred to PT for strain of tendon of R RTC that developed following CVA 05/2022. Pt CVA affected R side and that she did not receive therapy for her RUE following  stroke. R side is dominant side. She cannot raise her R arm, can't lift anything due to pain. General movement hurts.  She now takes extra time to get dressed and often uses her LUE to completed ADLs. She is doing everything by herself still, but it takes longer. Pt reports she is now on disability. Pt reports worst pain is a 6/10. If she sleeps she thinks pain may get to a 0, but otherwise notices it at all times. She describes  pain as a burning sensation, "like it is being ripped."  Pt unsure of her fine motor control R hand since she is not using it much  PMH per chart: CVA, HTN, preDM, sleep apnea, bipolar disorder, R hip pain, greater trochanteric pain syndrome or RLE, primary OA of R hip  PAIN:  Are you having pain? RUE pain, worst pain is 6/10  PRECAUTIONS:  None  RED FLAGS: Does report bladder changes since stroke (increased frequency); PT advised pt to discuss with her physician, pt verbalized understanding   WEIGHT BEARING RESTRICTIONS:  No  FALLS:  Has patient fallen in last 6 months? No  LIVING ENVIRONMENT: Lives with: lives alone Lives in: House/apartment Stairs: 4 steps with handrails  Has following equipment at home: Otho Blitz - 2 wheeled, reports not using any assistive devices   OCCUPATION:  On disability   PLOF:  Independent  PATIENT GOALS:  She'd like to be able to use her RUE, does not want to fully lose use of it.    OBJECTIVE:  Note: Objective measures were completed at Evaluation unless otherwise noted.  DIAGNOSTIC FINDINGS:  No pertinent imaging available in chart  PATIENT SURVEYS:  Quick Dash 61  COGNITIVE STATUS: Within functional limits for tasks assessed   SENSATION: Pt can feel a numbness/burning in her R arm Pt sensation intact to light touch BUE   Coordination: WFL rapid alt UE and chin to target  EDEMA:  Pt reports no swelling  POSTURE:  rounded shoulders, slight increase thoracic kyphosis   UE MMT: LUE: 4 to 4+/5 and no  pain RUE:  4-/5 * very pain limited flexion, abduction and IR; ER is only mm group not painful  Grip strength LUE 4/5 RUE 4+/5   UE AROM:  LUE: Flexion 160 deg Abduction 126 deg IR [facility]  ER needs formally assessment appears limited  RUE: Flexion 64 deg Abduction 62 deg IR [facility] ER needs formal assessment appears limited    HAND DOMINANCE:  Right                                                                                                                             TREATMENT DATE: 03/07/24  TE:   Nustepwith BUE/BLE for  BUE AAROM into flexion/extension. No pain  Seated:  Bicep curl with scapular depression 2 x 10, 2# DB Shoulder extension with RTB 2 x 10  - pain noted in ant shoulder with this activity  To supine:  Heat applied to post shoulder musculature for remainder of supine activities.  R UT stretch with GH depression 2 x 45 sec  R Levator stretch with GH depression 1 x 45 sec  Supine ER with YTB 2 x 10 ( pain ant shoulder noted at rep 5 on second set)   Seated transition:  UE ranger 20 x flex/ ext   -20 x ER/ IR  -20 x cw and 20 x CCW    Manual: on plinth STM  and TrP release to R  UT and anterior delt, as well as R posterior shoulder mm 8 min total.  Heat applied while STM performed for pt comfort and pain management     PATIENT EDUCATION:  Education details: exercise technique Person educated: Patient Education method: Explanation, Demonstration, and Verbal cues Education comprehension: verbalized understanding and returned demonstration  HOME EXERCISE PROGRAM: Access Code: [email or web address] URL: [email or web address] Date: 02/09/2024 Prepared by: Aminta Kales  Exercises - Shoulder External Rotation and Scapular Retraction with Resistance  - 1 x daily - 4-5 x weekly - 3 sets - 10 reps - 3 second hold/rep hold - Standing Shoulder Row with Anchored Resistance  - 1 x daily - 4-5 x weekly - 2 sets - 10 reps  Access Code: [email or web address] URL:  [email or web address] Date: 01/18/2024 Prepared by: Aurora Lees  Exercises - Putty Squeezes  - 2 x daily - 5-7 x weekly - 2 sets - 1 reps - 60 sec  hold - Finger Lumbricals with Putty  - 2 x daily - 5-7 x weekly - 2 sets - 1 reps - 60 sec hold - Seated Scapular Retraction  - 1 x daily - 7 x weekly - 2 sets - 10 reps - Seated Shoulder Shrug Circles AROM Forward  - 1 x daily - 7 x weekly - 2 sets - 10 reps - Isometric Shoulder Extension at Wall  - 1 x daily - 7 x weekly - 3 sets - 10 reps - Standing Isometric Shoulder Flexion with Doorway - Arm Bent  - 1 x daily - 7 x weekly - 3 sets - 10 reps   ASSESSMENT:  CLINICAL IMPRESSION:  Patient arrived with good motivation for completion of pt activities.   Ipt had some exacerbation of symptoms as a result of a lof of yard work she was doing yesterday. Pt reports shoulder is sore but feels better at end of session.  Pt instructed to  Pt will continue to benefit from skilled physical therapy intervention to address impairments, improve QOL, and attain therapy goals.    OBJECTIVE IMPAIRMENTS: Abnormal gait, decreased activity tolerance, decreased mobility, decreased ROM, decreased strength, hypomobility, impaired flexibility, impaired sensation, impaired UE functional use, improper body mechanics, postural dysfunction, and pain.   ACTIVITY LIMITATIONS: carrying, lifting, bathing, dressing, reach over head, hygiene/grooming, and locomotion level  PARTICIPATION LIMITATIONS: meal prep, cleaning, shopping, community activity, occupation, and yard work  PERSONAL FACTORS: Age, Sex, Time since onset of injury/illness/exacerbation, and 3+ comorbidities: PMH per chart: CVA, HTN, preDM, sleep apnea, bipolar disorder, R hip pain, greater trochanteric pain syndrome or RLE, primary OA of R hip are also affecting patient's functional outcome.   REHAB POTENTIAL: Good  CLINICAL DECISION MAKING: Evolving/moderate complexity  EVALUATION  COMPLEXITY: Moderate   GOALS: Goals reviewed with patient? Yes   SHORT TERM GOALS: Target date: 02/10/2024    Patient will be independent in home exercise program to improve strength/mobility for better functional independence with ADLs. Baseline:initiated; 4/28: pt performing HEP, to be advanced Goal status: IN PROGRESS   LONG TERM GOALS: Target date: 03/23/2024    Patient will decrease Quick DASH score by > 8 points demonstrating reduced self-reported upper extremity disability. Baseline: 61: 4/28: 70.45%  Goal status: IN PROGRESS  2.  Patient will improve R shoulder AROM to > 140 degrees of flexion, scaption, and abduction for improved ability to perform overhead activities. Baseline: flex/abd 64 deg/62 deg and pain limited; 01/31/24: flex 80 deg, abd 65 deg, scaption 50 deg Goal status: IN  PROGRESS  3.  Patient will reports a worst pain no greater than 4/10 in the past two weeks to indicate QOL improvement. Baseline: 6/10; 01/31/24: 4/10 but has modified activities to reduce increased pain Goal status: MET  4.  Patient will increase RUE gross strength to 4+/5 as to improve functional strength for increased ADL ability. Baseline: RUE strength grossly 4-/5; 01/31/24: pain limited grossly 4-/5  Goal status: IN PROGRESS     PLAN:  PT FREQUENCY: 1-2x/week  PT DURATION: 12 weeks  PLANNED INTERVENTIONS: 97164- PT Re-evaluation, 97110-Therapeutic exercises, 97530- Therapeutic activity, 97112- Neuromuscular re-education, 97535- Self Care, 40102- Manual therapy, [phone]- Gait training, [phone]- Orthotic Fit/training, [phone]- Splinting, Patient/Family education, Balance training, Stair training, Taping, Dry Needling, Joint mobilization, Spinal mobilization, Scar mobilization, DME instructions, Cryotherapy, and Moist heat.  PLAN FOR NEXT SESSION:  manual, PROM, isometrics. AROM as tolerated with resistance. Increase HEP   STM for pain management in R shoulder/cspine.   Edwina Gram, PT 03/07/2024, 8:53 AM

## 2024-03-09 ENCOUNTER — Ambulatory Visit: Admitting: Physical Therapy

## 2024-03-09 DIAGNOSIS — M25611 Stiffness of right shoulder, not elsewhere classified: Secondary | ICD-10-CM

## 2024-03-09 DIAGNOSIS — M25511 Pain in right shoulder: Secondary | ICD-10-CM | POA: Diagnosis not present

## 2024-03-09 DIAGNOSIS — M6281 Muscle weakness (generalized): Secondary | ICD-10-CM

## 2024-03-09 DIAGNOSIS — G8929 Other chronic pain: Secondary | ICD-10-CM

## 2024-03-09 NOTE — Therapy (Signed)
 OUTPATIENT PHYSICAL THERAPY TREATMENT  Patient Name: Jacqueline Steele MRN: [id number] DOB:09/05/1961, 63 y.o., female Today's Date: 03/09/2024  END OF SESSION:  PT End of Session - 03/09/24 0854     Visit Number 19    Number of Visits 25    Date for PT Re-Evaluation 03/23/24    Progress Note Due on Visit 20    PT Start Time 0850    PT Stop Time 0929    PT Time Calculation (min) 39 min    Activity Tolerance Patient limited by pain    Behavior During Therapy [facility] for tasks assessed/performed                     No past medical history on file. Past Surgical History:  Procedure Laterality Date   TEE WITHOUT CARDIOVERSION N/A 05/29/2022   Procedure: TRANSESOPHAGEAL ECHOCARDIOGRAM (TEE);  Surgeon: Devorah Fonder, MD;  Location: [facility] ORS;  Service: Cardiovascular;  Laterality: N/A;   TUBAL LIGATION     Patient Active Problem List   Diagnosis Date Noted   Acute stroke due to ischemia (HCC) 05/27/2022   Obesity (BMI 30-39.9) 05/27/2022    PCP: Osie Bleacher, Cleveland Dales, MD  REFERRING PROVIDER: Osie Bleacher, Cleveland Dales, MD  REFERRING DIAG:  Diagnosis  S46.011A (ICD-10-CM) - Strain of tendon of right rotator cuff    Rationale for Evaluation and Treatment: Rehabilitation  THERAPY DIAG:  Chronic right shoulder pain  Muscle weakness (generalized)  Stiffness of right shoulder, not elsewhere classified  ONSET DATE: 05/27/2022   SUBJECTIVE:                                                                                                                                                                                           SUBJECTIVE STATEMENT:   Pt reports that she rested her arm for a few days and it is feeling much better. No pain at start of PT session. Will be going to dinner with niece and other family members on Saturday.    PERTINENT HISTORY:  From EVAL=The pt is pleasant 63 y/o female referred to PT for strain of tendon of R RTC that developed following CVA 05/2022. Pt  CVA affected R side and that she did not receive therapy for her RUE following stroke. R side is dominant side. She cannot raise her R arm, can't lift anything due to pain. General movement hurts.  She now takes extra time to get dressed and often uses her LUE to completed ADLs. She is doing everything by herself still, but it takes longer. Pt reports she is now on disability. Pt reports worst pain is a 6/10.  If she sleeps she thinks pain may get to a 0, but otherwise notices it at all times. She describes pain as a burning sensation, "like it is being ripped."  Pt unsure of her fine motor control R hand since she is not using it much  PMH per chart: CVA, HTN, preDM, sleep apnea, bipolar disorder, R hip pain, greater trochanteric pain syndrome or RLE, primary OA of R hip  PAIN:  Are you having pain? RUE pain, worst pain is 6/10  PRECAUTIONS:  None  RED FLAGS: Does report bladder changes since stroke (increased frequency); PT advised pt to discuss with her physician, pt verbalized understanding   WEIGHT BEARING RESTRICTIONS:  No  FALLS:  Has patient fallen in last 6 months? No  LIVING ENVIRONMENT: Lives with: lives alone Lives in: House/apartment Stairs: 4 steps with handrails  Has following equipment at home: Otho Blitz - 2 wheeled, reports not using any assistive devices   OCCUPATION:  On disability   PLOF:  Independent  PATIENT GOALS:  She'd like to be able to use her RUE, does not want to fully lose use of it.    OBJECTIVE:  Note: Objective measures were completed at Evaluation unless otherwise noted.  DIAGNOSTIC FINDINGS:  No pertinent imaging available in chart  PATIENT SURVEYS:  Quick Dash 61  COGNITIVE STATUS: Within functional limits for tasks assessed   SENSATION: Pt can feel a numbness/burning in her R arm Pt sensation intact to light touch BUE   Coordination: WFL rapid alt UE and chin to target  EDEMA:  Pt reports no swelling  POSTURE:  rounded  shoulders, slight increase thoracic kyphosis   UE MMT: LUE: 4 to 4+/5 and no pain RUE:  4-/5 * very pain limited flexion, abduction and IR; ER is only mm group not painful  Grip strength LUE 4/5 RUE 4+/5   UE AROM:  LUE: Flexion 160 deg Abduction 126 deg IR [facility]  ER needs formally assessment appears limited  RUE: Flexion 64 deg Abduction 62 deg IR [facility] ER needs formal assessment appears limited    HAND DOMINANCE:  Right                                                                                                                             TREATMENT DATE: 03/09/24  TE:   UBE 2 min forward/2 min reverse.  UE AAROM flexion for therapy ball rollout x 12  Bicep flexion in supination and extension in pronation x 12 2# DB cues for scapular depression Shoulder flexion with foam ball with cues for scapular setting x 6 bil with mild pain in the R shoulder.  AAROM shoulder flexion to slide BUE across table. X 12   Sit<>supine without assist.  Manual.  PT applied heat pack to the R shoulder for pain management.  STM to lateral and anterior deltoid, as well as biceps brachii and distal pectoral fibers. With cross frictioal massage and TP release in biceps.  PROM with overpressure into lateral flexion x2 20 sec bil  PROM into rotation with overpressure 2 x 20 sec bil .     PATIENT EDUCATION:  Education details: exercise technique Person educated: Patient Education method: Explanation, Demonstration, and Verbal cues Education comprehension: verbalized understanding and returned demonstration  HOME EXERCISE PROGRAM: Access Code: [email or web address] URL: [email or web address] Date: 02/09/2024 Prepared by: Aminta Kales  Exercises - Shoulder External Rotation and Scapular Retraction with Resistance  - 1 x daily - 4-5 x weekly - 3 sets - 10 reps - 3 second hold/rep hold - Standing Shoulder Row with Anchored Resistance  - 1 x daily - 4-5 x weekly - 2 sets - 10  reps  Access Code: [email or web address] URL: [email or web address] Date: 01/18/2024 Prepared by: Aurora Lees  Exercises - Putty Squeezes  - 2 x daily - 5-7 x weekly - 2 sets - 1 reps - 60 sec  hold - Finger Lumbricals with Putty  - 2 x daily - 5-7 x weekly - 2 sets - 1 reps - 60 sec hold - Seated Scapular Retraction  - 1 x daily - 7 x weekly - 2 sets - 10 reps - Seated Shoulder Shrug Circles AROM Forward  - 1 x daily - 7 x weekly - 2 sets - 10 reps - Isometric Shoulder Extension at Wall  - 1 x daily - 7 x weekly - 3 sets - 10 reps - Standing Isometric Shoulder Flexion with Doorway - Arm Bent  - 1 x daily - 7 x weekly - 3 sets - 10 reps   ASSESSMENT:  CLINICAL IMPRESSION:  Patient arrived with good motivation for completion of pt activities. Mild pain with lightly resisted flexion on this day but otherwise no complaints of pain. States that shoulder felt better after rest for the last 2 days since picking weeds in garden bed. Will continue to benefit from increased strength training in pain free range as pain continues to improve as well as increased scapular positioning. Pt will continue to benefit from skilled physical therapy intervention to address impairments, improve QOL, and attain therapy goals.    OBJECTIVE IMPAIRMENTS: Abnormal gait, decreased activity tolerance, decreased mobility, decreased ROM, decreased strength, hypomobility, impaired flexibility, impaired sensation, impaired UE functional use, improper body mechanics, postural dysfunction, and pain.   ACTIVITY LIMITATIONS: carrying, lifting, bathing, dressing, reach over head, hygiene/grooming, and locomotion level  PARTICIPATION LIMITATIONS: meal prep, cleaning, shopping, community activity, occupation, and yard work  PERSONAL FACTORS: Age, Sex, Time since onset of injury/illness/exacerbation, and 3+ comorbidities: PMH per chart: CVA, HTN, preDM, sleep apnea, bipolar disorder, R hip pain, greater trochanteric pain  syndrome or RLE, primary OA of R hip are also affecting patient's functional outcome.   REHAB POTENTIAL: Good  CLINICAL DECISION MAKING: Evolving/moderate complexity  EVALUATION COMPLEXITY: Moderate   GOALS: Goals reviewed with patient? Yes   SHORT TERM GOALS: Target date: 02/10/2024    Patient will be independent in home exercise program to improve strength/mobility for better functional independence with ADLs. Baseline:initiated; 4/28: pt performing HEP, to be advanced Goal status: IN PROGRESS   LONG TERM GOALS: Target date: 03/23/2024    Patient will decrease Quick DASH score by > 8 points demonstrating reduced self-reported upper extremity disability. Baseline: 61: 4/28: 70.45%  Goal status: IN PROGRESS  2.  Patient will improve R shoulder AROM to > 140 degrees of flexion, scaption, and abduction for improved ability to perform overhead activities. Baseline: flex/abd 64 deg/62 deg and pain limited; 01/31/24: flex 80 deg, abd  65 deg, scaption 50 deg Goal status: IN PROGRESS  3.  Patient will reports a worst pain no greater than 4/10 in the past two weeks to indicate QOL improvement. Baseline: 6/10; 01/31/24: 4/10 but has modified activities to reduce increased pain Goal status: MET  4.  Patient will increase RUE gross strength to 4+/5 as to improve functional strength for increased ADL ability. Baseline: RUE strength grossly 4-/5; 01/31/24: pain limited grossly 4-/5  Goal status: IN PROGRESS     PLAN:  PT FREQUENCY: 1-2x/week  PT DURATION: 12 weeks  PLANNED INTERVENTIONS: 97164- PT Re-evaluation, 97110-Therapeutic exercises, 97530- Therapeutic activity, 97112- Neuromuscular re-education, 97535- Self Care, 16109- Manual therapy, [phone]- Gait training, [phone]- Orthotic Fit/training, [phone]- Splinting, Patient/Family education, Balance training, Stair training, Taping, Dry Needling, Joint mobilization, Spinal mobilization, Scar mobilization, DME instructions, Cryotherapy, and  Moist heat.  PLAN FOR NEXT SESSION:  manual, PROM, isometrics. AROM as tolerated with resistance.  Increase HEP  as pain allows  STM for pain management in R shoulder/cspine.   Barbara Book, PT 03/09/2024, 8:54 AM

## 2024-03-14 ENCOUNTER — Ambulatory Visit

## 2024-03-14 DIAGNOSIS — M25511 Pain in right shoulder: Secondary | ICD-10-CM | POA: Diagnosis not present

## 2024-03-14 DIAGNOSIS — G8929 Other chronic pain: Secondary | ICD-10-CM

## 2024-03-14 DIAGNOSIS — M6281 Muscle weakness (generalized): Secondary | ICD-10-CM

## 2024-03-14 DIAGNOSIS — M25611 Stiffness of right shoulder, not elsewhere classified: Secondary | ICD-10-CM

## 2024-03-14 NOTE — Therapy (Signed)
 OUTPATIENT PHYSICAL THERAPY TREATMENT/Physical Therapy Progress Note   Dates of reporting period  01/31/2024   to   03/14/2024   Patient Name: Jacqueline Steele MRN: [id number] DOB:1961-01-09, 63 y.o., female Today's Date: 03/14/2024  END OF SESSION:  PT End of Session - 03/14/24 0802     Visit Number 20    Number of Visits 25    Date for PT Re-Evaluation 03/23/24    Progress Note Due on Visit 20    Activity Tolerance Patient limited by pain    Behavior During Therapy [facility] for tasks assessed/performed                     No past medical history on file. Past Surgical History:  Procedure Laterality Date   TEE WITHOUT CARDIOVERSION N/A 05/29/2022   Procedure: TRANSESOPHAGEAL ECHOCARDIOGRAM (TEE);  Surgeon: Devorah Fonder, MD;  Location: [facility] ORS;  Service: Cardiovascular;  Laterality: N/A;   TUBAL LIGATION     Patient Active Problem List   Diagnosis Date Noted   Acute stroke due to ischemia (HCC) 05/27/2022   Obesity (BMI 30-39.9) 05/27/2022    PCP: Kathrynn Park, MD  REFERRING PROVIDER: Osie Bleacher, Cleveland Dales, MD  REFERRING DIAG:  Diagnosis  S46.011A (ICD-10-CM) - Strain of tendon of right rotator cuff    Rationale for Evaluation and Treatment: Rehabilitation  THERAPY DIAG:  No diagnosis found.  ONSET DATE: 05/27/2022   SUBJECTIVE:                                                                                                                                                                                           SUBJECTIVE STATEMENT:   Pt reports no pain at this moment; she rested her arm this weekend.   PERTINENT HISTORY:  From EVAL=The pt is pleasant 63 y/o female referred to PT for strain of tendon of R RTC that developed following CVA 05/2022. Pt CVA affected R side and that she did not receive therapy for her RUE following stroke. R side is dominant side. She cannot raise her R arm, can't lift anything due to pain. General movement hurts.  She now  takes extra time to get dressed and often uses her LUE to completed ADLs. She is doing everything by herself still, but it takes longer. Pt reports she is now on disability. Pt reports worst pain is a 6/10. If she sleeps she thinks pain may get to a 0, but otherwise notices it at all times. She describes pain as a burning sensation, "like it is being ripped."  Pt unsure of her fine motor control R hand since she is  not using it much  PMH per chart: CVA, HTN, preDM, sleep apnea, bipolar disorder, R hip pain, greater trochanteric pain syndrome or RLE, primary OA of R hip  PAIN:  Are you having pain? RUE pain, worst pain is 6/10  PRECAUTIONS:  None  RED FLAGS: Does report bladder changes since stroke (increased frequency); PT advised pt to discuss with her physician, pt verbalized understanding   WEIGHT BEARING RESTRICTIONS:  No  FALLS:  Has patient fallen in last 6 months? No  LIVING ENVIRONMENT: Lives with: lives alone Lives in: House/apartment Stairs: 4 steps with handrails  Has following equipment at home: Otho Blitz - 2 wheeled, reports not using any assistive devices   OCCUPATION:  On disability   PLOF:  Independent  PATIENT GOALS:  She'd like to be able to use her RUE, does not want to fully lose use of it.    OBJECTIVE:  Note: Objective measures were completed at Evaluation unless otherwise noted.  DIAGNOSTIC FINDINGS:  No pertinent imaging available in chart  PATIENT SURVEYS:  Quick Dash 61  COGNITIVE STATUS: Within functional limits for tasks assessed   SENSATION: Pt can feel a numbness/burning in her R arm Pt sensation intact to light touch BUE   Coordination: WFL rapid alt UE and chin to target  EDEMA:  Pt reports no swelling  POSTURE:  rounded shoulders, slight increase thoracic kyphosis   UE MMT: LUE: 4 to 4+/5 and no pain RUE:  4-/5 * very pain limited flexion, abduction and IR; ER is only mm group not painful  Grip strength LUE 4/5 RUE 4+/5    UE AROM:  LUE: Flexion 160 deg Abduction 126 deg IR [facility]  ER needs formally assessment appears limited  RUE: Flexion 64 deg Abduction 62 deg IR [facility] ER needs formal assessment appears limited    HAND DOMINANCE:  Right                                                                                                                             TREATMENT DATE: 03/14/24    TE:   MMT RUE - please see goal section below for results  AROM RUE flexion, abduction, scaption - please see goal section below for results  Quick Dash: 61.4   Pain questionnaire/worst pain  - please see goal section below for results   Manual:  Heat donned to R shoulder while pt completes the following (no adverse reaction to heat, skin WNL prior to and upon removal of heat). Pt supine / bolster supported- IASTM to R ant delt, R UT, R cervical parapsinals, and R bicep PROM flex/abd/scap 10 + reps for each motion, completing within pt's pain-free range and holding when gentle stretch felt      PATIENT EDUCATION:  Education details: exercise technique Person educated: Patient Education method: Explanation, Demonstration, and Verbal cues Education comprehension: verbalized understanding and returned demonstration  HOME EXERCISE PROGRAM: Access Code: [email or web address] URL: [email or web address] Date: 02/09/2024 Prepared by:  Aminta Kales  Exercises - Shoulder External Rotation and Scapular Retraction with Resistance  - 1 x daily - 4-5 x weekly - 3 sets - 10 reps - 3 second hold/rep hold - Standing Shoulder Row with Anchored Resistance  - 1 x daily - 4-5 x weekly - 2 sets - 10 reps  Access Code: [email or web address] URL: [email or web address] Date: 01/18/2024 Prepared by: Aurora Lees  Exercises - Putty Squeezes  - 2 x daily - 5-7 x weekly - 2 sets - 1 reps - 60 sec  hold - Finger Lumbricals with Putty  - 2 x daily - 5-7 x weekly - 2 sets - 1 reps - 60 sec hold - Seated Scapular  Retraction  - 1 x daily - 7 x weekly - 2 sets - 10 reps - Seated Shoulder Shrug Circles AROM Forward  - 1 x daily - 7 x weekly - 2 sets - 10 reps - Isometric Shoulder Extension at Wall  - 1 x daily - 7 x weekly - 3 sets - 10 reps - Standing Isometric Shoulder Flexion with Doorway - Arm Bent  - 1 x daily - 7 x weekly - 3 sets - 10 reps   ASSESSMENT:  CLINICAL IMPRESSION:  Goal assessment completed for progress visit. Pt making gains AEB improving performance on majority of LTG compared to previous assessment, indicating decreased disability due to RUE pain, improved RUE AROM and improved strength. Exception was worst pain pt experienced, as pt with recent flare-up where she felt 9/10 pain, but no pain since. Patient's condition has the potential to improve in response to therapy. Maximum improvement is yet to be obtained. The anticipated improvement is attainable and reasonable in a generally predictable time. Pt will continue to benefit from skilled physical therapy intervention to address impairments, improve QOL, and attain therapy goals.    OBJECTIVE IMPAIRMENTS: Abnormal gait, decreased activity tolerance, decreased mobility, decreased ROM, decreased strength, hypomobility, impaired flexibility, impaired sensation, impaired UE functional use, improper body mechanics, postural dysfunction, and pain.   ACTIVITY LIMITATIONS: carrying, lifting, bathing, dressing, reach over head, hygiene/grooming, and locomotion level  PARTICIPATION LIMITATIONS: meal prep, cleaning, shopping, community activity, occupation, and yard work  PERSONAL FACTORS: Age, Sex, Time since onset of injury/illness/exacerbation, and 3+ comorbidities: PMH per chart: CVA, HTN, preDM, sleep apnea, bipolar disorder, R hip pain, greater trochanteric pain syndrome or RLE, primary OA of R hip are also affecting patient's functional outcome.   REHAB POTENTIAL: Good  CLINICAL DECISION MAKING: Evolving/moderate  complexity  EVALUATION COMPLEXITY: Moderate   GOALS: Goals reviewed with patient? Yes   SHORT TERM GOALS: Target date: 02/10/2024    Patient will be independent in home exercise program to improve strength/mobility for better functional independence with ADLs. Baseline:initiated; 4/28: pt performing HEP, to be advanced; 6/10: pt almost indep with HEP Goal status: IN PROGRESS   LONG TERM GOALS: Target date: 03/23/2024    Patient will decrease Quick DASH score by > 8 points demonstrating reduced self-reported upper extremity disability. Baseline: 61: 4/28: 70.45%; 03/14/24:  61.4 Goal status: IN PROGRESS  2.  Patient will improve R shoulder AROM to > 140 degrees of flexion, scaption, and abduction for improved ability to perform overhead activities. Baseline: flex/abd 64 deg/62 deg and pain limited; 01/31/24: flex 80 deg, abd 65 deg, scaption 50 deg; 6/10: flex 140 deg, abduct 90 deg, scaption 90 Goal status: IN PROGRESS  3.  Patient will reports a worst pain no greater than 4/10 in the past two weeks to indicate  QOL improvement. Baseline: 6/10; 01/31/24: 4/10 but has modified activities to reduce increased pain; 03/14/24: 9/10 with a particular movement within past week, but since this moment no pain Goal status: MET (previously met/partially met currently)  4.  Patient will increase RUE gross strength to 4+/5 as to improve functional strength for increased ADL ability. Baseline: RUE strength grossly 4-/5; 01/31/24: pain limited grossly 4-/5; 03/14/24: RUE strength grossly 4/5 Goal status: IN PROGRESS     PLAN:  PT FREQUENCY: 1-2x/week  PT DURATION: 12 weeks  PLANNED INTERVENTIONS: 97164- PT Re-evaluation, 97110-Therapeutic exercises, 97530- Therapeutic activity, 97112- Neuromuscular re-education, 97535- Self Care, [id number]- Manual therapy, [phone]- Gait training, [phone]- Orthotic Fit/training, [phone]- Splinting, Patient/Family education, Balance training, Stair training, Taping, Dry  Needling, Joint mobilization, Spinal mobilization, Scar mobilization, DME instructions, Cryotherapy, and Moist heat.  PLAN FOR NEXT SESSION:  manual, PROM, isometrics. AROM as tolerated with resistance.  Increase HEP  as pain allows  STM for pain management in R shoulder/cspine.   Samie Crews, PT 03/14/2024, 8:02 AM

## 2024-03-16 ENCOUNTER — Ambulatory Visit

## 2024-03-16 DIAGNOSIS — G8929 Other chronic pain: Secondary | ICD-10-CM

## 2024-03-16 DIAGNOSIS — M25511 Pain in right shoulder: Secondary | ICD-10-CM | POA: Diagnosis not present

## 2024-03-16 DIAGNOSIS — M6281 Muscle weakness (generalized): Secondary | ICD-10-CM

## 2024-03-16 DIAGNOSIS — M25611 Stiffness of right shoulder, not elsewhere classified: Secondary | ICD-10-CM

## 2024-03-16 NOTE — Therapy (Signed)
 OUTPATIENT PHYSICAL THERAPY TREATMENT   Patient Name: Jacqueline Steele MRN: [id number] DOB:Nov 22, 1960, 63 y.o., female Today's Date: 03/16/2024  END OF SESSION:  PT End of Session - 03/16/24 1025     Visit Number 21    Number of Visits 25    Date for PT Re-Evaluation 03/23/24    Progress Note Due on Visit 20    PT Start Time 0937    PT Stop Time 1023    PT Time Calculation (min) 46 min    Activity Tolerance Patient limited by pain    Behavior During Therapy [facility] for tasks assessed/performed                  History reviewed. No pertinent past medical history. Past Surgical History:  Procedure Laterality Date   TEE WITHOUT CARDIOVERSION N/A 05/29/2022   Procedure: TRANSESOPHAGEAL ECHOCARDIOGRAM (TEE);  Surgeon: Devorah Fonder, MD;  Location: [facility] ORS;  Service: Cardiovascular;  Laterality: N/A;   TUBAL LIGATION     Patient Active Problem List   Diagnosis Date Noted   Acute stroke due to ischemia (HCC) 05/27/2022   Obesity (BMI 30-39.9) 05/27/2022    PCP: Osie Bleacher, Cleveland Dales, MD  REFERRING PROVIDER: Osie Bleacher, Cleveland Dales, MD  REFERRING DIAG:  Diagnosis  S46.011A (ICD-10-CM) - Strain of tendon of right rotator cuff    Rationale for Evaluation and Treatment: Rehabilitation  THERAPY DIAG:  Chronic right shoulder pain  Stiffness of right shoulder, not elsewhere classified  Muscle weakness (generalized)  ONSET DATE: 05/27/2022   SUBJECTIVE:                                                                                                                                                                                           SUBJECTIVE STATEMENT:   Pt reports no pain with RUE at rest.  PERTINENT HISTORY:  From EVAL=The pt is pleasant 63 y/o female referred to PT for strain of tendon of R RTC that developed following CVA 05/2022. Pt CVA affected R side and that she did not receive therapy for her RUE following stroke. R side is dominant side. She cannot raise her R  arm, can't lift anything due to pain. General movement hurts.  She now takes extra time to get dressed and often uses her LUE to completed ADLs. She is doing everything by herself still, but it takes longer. Pt reports she is now on disability. Pt reports worst pain is a 6/10. If she sleeps she thinks pain may get to a 0, but otherwise notices it at all times. She describes pain as a burning sensation, like it is being ripped.  Pt  unsure of her fine motor control R hand since she is not using it much  PMH per chart: CVA, HTN, preDM, sleep apnea, bipolar disorder, R hip pain, greater trochanteric pain syndrome or RLE, primary OA of R hip  PAIN:  Are you having pain? RUE pain, worst pain is 6/10  PRECAUTIONS:  None  RED FLAGS: Does report bladder changes since stroke (increased frequency); PT advised pt to discuss with her physician, pt verbalized understanding   WEIGHT BEARING RESTRICTIONS:  No  FALLS:  Has patient fallen in last 6 months? No  LIVING ENVIRONMENT: Lives with: lives alone Lives in: House/apartment Stairs: 4 steps with handrails  Has following equipment at home: Otho Blitz - 2 wheeled, reports not using any assistive devices   OCCUPATION:  On disability   PLOF:  Independent  PATIENT GOALS:  She'd like to be able to use her RUE, does not want to fully lose use of it.    OBJECTIVE:  Note: Objective measures were completed at Evaluation unless otherwise noted.  DIAGNOSTIC FINDINGS:  No pertinent imaging available in chart  PATIENT SURVEYS:  Quick Dash 61  COGNITIVE STATUS: Within functional limits for tasks assessed   SENSATION: Pt can feel a numbness/burning in her R arm Pt sensation intact to light touch BUE   Coordination: WFL rapid alt UE and chin to target  EDEMA:  Pt reports no swelling  POSTURE:  rounded shoulders, slight increase thoracic kyphosis   UE MMT: LUE: 4 to 4+/5 and no pain RUE:  4-/5 * very pain limited flexion, abduction and  IR; ER is only mm group not painful  Grip strength LUE 4/5 RUE 4+/5   UE AROM:  LUE: Flexion 160 deg Abduction 126 deg IR [facility]  ER needs formally assessment appears limited  RUE: Flexion 64 deg Abduction 62 deg IR [facility] ER needs formal assessment appears limited    HAND DOMINANCE:  Right                                                                                                                             TREATMENT DATE: 03/16/24  Manual Therapy: 15 min supine   10 min STM to R pec minor to assist in improved R shoulder pain and improve scapulohumeral rhythm.   R GHJ grade 3 AP/PA mobilizations for improved R shoulder mobility in overhead and behind back ranges of motion. 5x10 sec bouts/plane   There.Ex:   Hook lying R shoulder PROM: flexion, ER, IR 5 reps each  Hook lying Serratus punches: 2x8. Max TC's for first set for form/technique. Completes at supervision next set.   L side lying R shoulder ER: 3x8   R shoulder AROM post PT session:   Flexion: 145 deg   Abduction: 100 deg   Neuro Re-Ed:  Seated R shoulder scapular assist with shoulder flexion. 3x8 to improve scapular upward rotation and protraction with shoulder flexion and scapular depression and downward rotation with extension back  to neutral. Pt reports reduced pain response and greater ROM with intervention. Indicative of periscapular and RTC weakness leading to R shoulder pain.      PATIENT EDUCATION:  Education details: exercise technique Person educated: Patient Education method: Explanation, Demonstration, and Verbal cues Education comprehension: verbalized understanding and returned demonstration  HOME EXERCISE PROGRAM: Access Code: [email or web address] URL: [email or web address] Date: 02/09/2024 Prepared by: Aminta Kales  Exercises - Shoulder External Rotation and Scapular Retraction with Resistance  - 1 x daily - 4-5 x weekly - 3 sets - 10 reps - 3 second hold/rep hold - Standing  Shoulder Row with Anchored Resistance  - 1 x daily - 4-5 x weekly - 2 sets - 10 reps  Access Code: [email or web address] URL: [email or web address] Date: 01/18/2024 Prepared by: Aurora Lees  Exercises - Putty Squeezes  - 2 x daily - 5-7 x weekly - 2 sets - 1 reps - 60 sec  hold - Finger Lumbricals with Putty  - 2 x daily - 5-7 x weekly - 2 sets - 1 reps - 60 sec hold - Seated Scapular Retraction  - 1 x daily - 7 x weekly - 2 sets - 10 reps - Seated Shoulder Shrug Circles AROM Forward  - 1 x daily - 7 x weekly - 2 sets - 10 reps - Isometric Shoulder Extension at Wall  - 1 x daily - 7 x weekly - 3 sets - 10 reps - Standing Isometric Shoulder Flexion with Doorway - Arm Bent  - 1 x daily - 7 x weekly - 3 sets - 10 reps   ASSESSMENT:  CLINICAL IMPRESSION: Continuing PT POC working on R shoulder pain and ROM deficits. Heavy focus today on pec minor release and scapular upward rotation strength training. Pt doe have reduced pain and increased ROM with scapular assist neuro re-ed and improved ROM post session with reduced pain after interventions this date. Pt will benefit from f/u on today's session for meaningful pain and ROM response to see if further manual interventions would continue to be beneficial for pt's case. Update HEP as appropriate following pt's tolerance for today's session. Pt will continue to benefit from skilled physical therapy intervention to address impairments, improve QOL, and attain therapy goals.     OBJECTIVE IMPAIRMENTS: Abnormal gait, decreased activity tolerance, decreased mobility, decreased ROM, decreased strength, hypomobility, impaired flexibility, impaired sensation, impaired UE functional use, improper body mechanics, postural dysfunction, and pain.   ACTIVITY LIMITATIONS: carrying, lifting, bathing, dressing, reach over head, hygiene/grooming, and locomotion level  PARTICIPATION LIMITATIONS: meal prep, cleaning, shopping, community activity, occupation, and  yard work  PERSONAL FACTORS: Age, Sex, Time since onset of injury/illness/exacerbation, and 3+ comorbidities: PMH per chart: CVA, HTN, preDM, sleep apnea, bipolar disorder, R hip pain, greater trochanteric pain syndrome or RLE, primary OA of R hip are also affecting patient's functional outcome.   REHAB POTENTIAL: Good  CLINICAL DECISION MAKING: Evolving/moderate complexity  EVALUATION COMPLEXITY: Moderate   GOALS: Goals reviewed with patient? Yes   SHORT TERM GOALS: Target date: 02/10/2024    Patient will be independent in home exercise program to improve strength/mobility for better functional independence with ADLs. Baseline:initiated; 4/28: pt performing HEP, to be advanced; 6/10: pt almost indep with HEP Goal status: IN PROGRESS   LONG TERM GOALS: Target date: 03/23/2024    Patient will decrease Quick DASH score by > 8 points demonstrating reduced self-reported upper extremity disability. Baseline: 61: 4/28: 70.45%; 03/14/24:  61.4 Goal status: IN PROGRESS  2.  Patient will improve R  shoulder AROM to > 140 degrees of flexion, scaption, and abduction for improved ability to perform overhead activities. Baseline: flex/abd 64 deg/62 deg and pain limited; 01/31/24: flex 80 deg, abd 65 deg, scaption 50 deg; 6/10: flex 140 deg, abduct 90 deg, scaption 90 Goal status: IN PROGRESS  3.  Patient will reports a worst pain no greater than 4/10 in the past two weeks to indicate QOL improvement. Baseline: 6/10; 01/31/24: 4/10 but has modified activities to reduce increased pain; 03/14/24: 9/10 with a particular movement within past week, but since this moment no pain Goal status: MET (previously met/partially met currently)  4.  Patient will increase RUE gross strength to 4+/5 as to improve functional strength for increased ADL ability. Baseline: RUE strength grossly 4-/5; 01/31/24: pain limited grossly 4-/5; 03/14/24: RUE strength grossly 4/5 Goal status: IN PROGRESS     PLAN:  PT  FREQUENCY: 1-2x/week  PT DURATION: 12 weeks  PLANNED INTERVENTIONS: 97164- PT Re-evaluation, 97110-Therapeutic exercises, 97530- Therapeutic activity, 97112- Neuromuscular re-education, 97535- Self Care, [id number]- Manual therapy, [phone]- Gait training, [phone]- Orthotic Fit/training, [phone]- Splinting, Patient/Family education, Balance training, Stair training, Taping, Dry Needling, Joint mobilization, Spinal mobilization, Scar mobilization, DME instructions, Cryotherapy, and Moist heat.  PLAN FOR NEXT SESSION:  F/u with side lying ER and serratus punches and pec minor release. Add to HEP if tolerated previous session well  Marc Senior. Fairly IV, PT, DPT Physical Therapist- [place]  [facility]  03/16/2024, 10:39 AM

## 2024-03-21 ENCOUNTER — Ambulatory Visit

## 2024-03-21 DIAGNOSIS — M25511 Pain in right shoulder: Secondary | ICD-10-CM | POA: Diagnosis not present

## 2024-03-21 DIAGNOSIS — M25611 Stiffness of right shoulder, not elsewhere classified: Secondary | ICD-10-CM

## 2024-03-21 DIAGNOSIS — M6281 Muscle weakness (generalized): Secondary | ICD-10-CM

## 2024-03-21 DIAGNOSIS — G8929 Other chronic pain: Secondary | ICD-10-CM

## 2024-03-21 NOTE — Therapy (Signed)
 OUTPATIENT PHYSICAL THERAPY TREATMENT   Patient Name: Jacqueline Steele MRN: [id number] DOB:11-Nov-1960, 63 y.o., female Today's Date: 03/21/2024  END OF SESSION:  PT End of Session - 03/21/24 0752     Visit Number 22    Number of Visits 25    Date for PT Re-Evaluation 03/23/24    Progress Note Due on Visit 20    PT Start Time 0800    PT Stop Time 0844    PT Time Calculation (min) 44 min    Activity Tolerance Patient limited by pain    Behavior During Therapy [facility] for tasks assessed/performed                  History reviewed. No pertinent past medical history. Past Surgical History:  Procedure Laterality Date   TEE WITHOUT CARDIOVERSION N/A 05/29/2022   Procedure: TRANSESOPHAGEAL ECHOCARDIOGRAM (TEE);  Surgeon: Devorah Fonder, MD;  Location: [facility] ORS;  Service: Cardiovascular;  Laterality: N/A;   TUBAL LIGATION     Patient Active Problem List   Diagnosis Date Noted   Acute stroke due to ischemia (HCC) 05/27/2022   Obesity (BMI 30-39.9) 05/27/2022    PCP: Osie Bleacher, [place] Dales, MD  REFERRING PROVIDER: Osie Bleacher, Cleveland Dales, MD  REFERRING DIAG:  Diagnosis  S46.011A (ICD-10-CM) - Strain of tendon of right rotator cuff    Rationale for Evaluation and Treatment: Rehabilitation  THERAPY DIAG:  Chronic right shoulder pain  Stiffness of right shoulder, not elsewhere classified  Muscle weakness (generalized)  ONSET DATE: 05/27/2022   SUBJECTIVE:                                                                                                                                                                                           SUBJECTIVE STATEMENT:   Pt reports tolerating her last session well. Has been working on her HEP. Reports pain overall in her shoulder has remained consistent.    PERTINENT HISTORY:  From EVAL=The pt is pleasant 63 y/o female referred to PT for strain of tendon of R RTC that developed following CVA 05/2022. Pt CVA affected R side and that she  did not receive therapy for her RUE following stroke. R side is dominant side. She cannot raise her R arm, can't lift anything due to pain. General movement hurts.  She now takes extra time to get dressed and often uses her LUE to completed ADLs. She is doing everything by herself still, but it takes longer. Pt reports she is now on disability. Pt reports worst pain is a 6/10. If she sleeps she thinks pain may get to a 0, but otherwise notices it at  all times. She describes pain as a burning sensation, like it is being ripped.  Pt unsure of her fine motor control R hand since she is not using it much  PMH per chart: CVA, HTN, preDM, sleep apnea, bipolar disorder, R hip pain, greater trochanteric pain syndrome or RLE, primary OA of R hip  PAIN:  Are you having pain? RUE pain, worst pain is 6/10  PRECAUTIONS:  None  RED FLAGS: Does report bladder changes since stroke (increased frequency); PT advised pt to discuss with her physician, pt verbalized understanding   WEIGHT BEARING RESTRICTIONS:  No  FALLS:  Has patient fallen in last 6 months? No  LIVING ENVIRONMENT: Lives with: lives alone Lives in: House/apartment Stairs: 4 steps with handrails  Has following equipment at home: Otho Blitz - 2 wheeled, reports not using any assistive devices   OCCUPATION:  On disability   PLOF:  Independent  PATIENT GOALS:  She'd like to be able to use her RUE, does not want to fully lose use of it.    OBJECTIVE:  Note: Objective measures were completed at Evaluation unless otherwise noted.  DIAGNOSTIC FINDINGS:  No pertinent imaging available in chart  PATIENT SURVEYS:  Quick Dash 61  COGNITIVE STATUS: Within functional limits for tasks assessed   SENSATION: Pt can feel a numbness/burning in her R arm Pt sensation intact to light touch BUE   Coordination: WFL rapid alt UE and chin to target  EDEMA:  Pt reports no swelling  POSTURE:  rounded shoulders, slight increase thoracic  kyphosis   UE MMT: LUE: 4 to 4+/5 and no pain RUE:  4-/5 * very pain limited flexion, abduction and IR; ER is only mm group not painful  Grip strength LUE 4/5 RUE 4+/5   UE AROM:  LUE: Flexion 160 deg Abduction 126 deg IR [facility]  ER needs formally assessment appears limited  RUE: Flexion 64 deg Abduction 62 deg IR [facility] ER needs formal assessment appears limited    HAND DOMINANCE:  Right                                                                                                                             TREATMENT DATE: 03/21/24  Manual Therapy: 10 min supine   6 min STM to R pec minor to assist in improved R shoulder pain and improved scapulohumeral rhythm.   R GHJ grade 3 AP/PA mobilizations for improved R shoulder mobility in overhead and behind back ranges of motion. 2x10 sec bouts/plane   There.Ex:   Hook lying Serratus punches: 3x8. Max TC's for first set for form/technique. Completes at supervision next set.    L side lying R shoulder ER: 3x8   Standing blue TB scap retraction with blue TB: 3x15   Standing shoulder extension/low row with GTB: 3x12  Updated HEP with new hand out provided. Discussed reps/sets/frequency    Neuro Re-Ed:  Seated R shoulder scapular assist with shoulder flexion. 3x8 to  improve scapular upward rotation and protraction with shoulder flexion and scapular depression and downward rotation with extension back to neutral. Pt reports reduced pain response and greater ROM with intervention. Indicative of periscapular and RTC weakness leading to R shoulder pain.     R shoulder AROM post PT session:   Flexion: 150 deg   Abduction: 120 deg   PATIENT EDUCATION:  Education details: exercise technique Person educated: Patient Education method: Explanation, Demonstration, and Verbal cues Education comprehension: verbalized understanding and returned demonstration  HOME EXERCISE PROGRAM:  Access Code: [email or web address] URL:  [email or web address] Date: 03/21/2024 Prepared by: Lyda Samples  Exercises - Shoulder External Rotation and Scapular Retraction with Resistance  - 1 x daily - 4-5 x weekly - 3 sets - 10 reps - 3 second hold/rep hold - Standing Shoulder Row with Anchored Resistance  - 1 x daily - 4-5 x weekly - 2 sets - 10 reps - Hooklying Scapular Protraction on Foam Roll  - 1 x daily - 4-5 x weekly - 3 sets - 8 reps - Sidelying Shoulder External Rotation  - 1 x daily - 4-5 x weekly - 3 sets - 8 reps  Access Code: [email or web address] URL: [email or web address] Date: 02/09/2024 Prepared by: Aminta Kales  Exercises - Shoulder External Rotation and Scapular Retraction with Resistance  - 1 x daily - 4-5 x weekly - 3 sets - 10 reps - 3 second hold/rep hold - Standing Shoulder Row with Anchored Resistance  - 1 x daily - 4-5 x weekly - 2 sets - 10 reps  Access Code: [email or web address] URL: [email or web address] Date: 01/18/2024 Prepared by: Aurora Lees  Exercises - Putty Squeezes  - 2 x daily - 5-7 x weekly - 2 sets - 1 reps - 60 sec  hold - Finger Lumbricals with Putty  - 2 x daily - 5-7 x weekly - 2 sets - 1 reps - 60 sec hold - Seated Scapular Retraction  - 1 x daily - 7 x weekly - 2 sets - 10 reps - Seated Shoulder Shrug Circles AROM Forward  - 1 x daily - 7 x weekly - 2 sets - 10 reps - Isometric Shoulder Extension at Wall  - 1 x daily - 7 x weekly - 3 sets - 10 reps - Standing Isometric Shoulder Flexion with Doorway - Arm Bent  - 1 x daily - 7 x weekly - 3 sets - 10 reps   ASSESSMENT:  CLINICAL IMPRESSION: Continuing PT POC working on R shoulder pain and ROM deficits. Pt continues to benefit from NMR and pec minor release with continuous improvement in AROM the last 2 sessions. Updated HEP to address serratus anterior strength and continued shoulder external rotators to assist in scapular upward rotation and protraction. Pt aware she is approaching end of POC next visit and  will need to discussion need for recert or discharge. PT plan for re-assessing goals next session.  OBJECTIVE IMPAIRMENTS: Abnormal gait, decreased activity tolerance, decreased mobility, decreased ROM, decreased strength, hypomobility, impaired flexibility, impaired sensation, impaired UE functional use, improper body mechanics, postural dysfunction, and pain.   ACTIVITY LIMITATIONS: carrying, lifting, bathing, dressing, reach over head, hygiene/grooming, and locomotion level  PARTICIPATION LIMITATIONS: meal prep, cleaning, shopping, community activity, occupation, and yard work  PERSONAL FACTORS: Age, Sex, Time since onset of injury/illness/exacerbation, and 3+ comorbidities: PMH per chart: CVA, HTN, preDM, sleep apnea, bipolar disorder, R hip pain, greater trochanteric pain syndrome or RLE, primary OA of R hip are also affecting patient's functional outcome.   REHAB  POTENTIAL: Good  CLINICAL DECISION MAKING: Evolving/moderate complexity  EVALUATION COMPLEXITY: Moderate   GOALS: Goals reviewed with patient? Yes   SHORT TERM GOALS: Target date: 02/10/2024    Patient will be independent in home exercise program to improve strength/mobility for better functional independence with ADLs. Baseline:initiated; 4/28: pt performing HEP, to be advanced; 6/10: pt almost indep with HEP Goal status: IN PROGRESS   LONG TERM GOALS: Target date: 03/23/2024    Patient will decrease Quick DASH score by > 8 points demonstrating reduced self-reported upper extremity disability. Baseline: 61: 4/28: 70.45%; 03/14/24:  61.4 Goal status: IN PROGRESS  2.  Patient will improve R shoulder AROM to > 140 degrees of flexion, scaption, and abduction for improved ability to perform overhead activities. Baseline: flex/abd 64 deg/62 deg and pain limited; 01/31/24: flex 80 deg, abd 65 deg, scaption 50 deg; 6/10: flex 140 deg, abduct 90 deg, scaption 90 Goal status: IN PROGRESS  3.  Patient will reports a worst  pain no greater than 4/10 in the past two weeks to indicate QOL improvement. Baseline: 6/10; 01/31/24: 4/10 but has modified activities to reduce increased pain; 03/14/24: 9/10 with a particular movement within past week, but since this moment no pain Goal status: MET (previously met/partially met currently)  4.  Patient will increase RUE gross strength to 4+/5 as to improve functional strength for increased ADL ability. Baseline: RUE strength grossly 4-/5; 01/31/24: pain limited grossly 4-/5; 03/14/24: RUE strength grossly 4/5 Goal status: IN PROGRESS     PLAN:  PT FREQUENCY: 1-2x/week  PT DURATION: 12 weeks  PLANNED INTERVENTIONS: 97164- PT Re-evaluation, 97110-Therapeutic exercises, 97530- Therapeutic activity, 97112- Neuromuscular re-education, 97535- Self Care, 62952- Manual therapy, [phone]- Gait training, [phone]- Orthotic Fit/training, [phone]- Splinting, Patient/Family education, Balance training, Stair training, Taping, Dry Needling, Joint mobilization, Spinal mobilization, Scar mobilization, DME instructions, Cryotherapy, and Moist heat.  PLAN FOR NEXT SESSION:  RECERT or D/C  Marc Senior. Fairly IV, PT, DPT Physical Therapist- [place]  [facility]  03/21/2024, 9:41 AM

## 2024-03-23 ENCOUNTER — Ambulatory Visit: Admitting: Physical Therapy

## 2024-03-28 ENCOUNTER — Ambulatory Visit: Admitting: Physical Therapy

## 2024-03-28 DIAGNOSIS — M6281 Muscle weakness (generalized): Secondary | ICD-10-CM

## 2024-03-28 DIAGNOSIS — G8929 Other chronic pain: Secondary | ICD-10-CM

## 2024-03-28 DIAGNOSIS — M25611 Stiffness of right shoulder, not elsewhere classified: Secondary | ICD-10-CM

## 2024-03-28 DIAGNOSIS — M25511 Pain in right shoulder: Secondary | ICD-10-CM | POA: Diagnosis not present

## 2024-03-28 NOTE — Therapy (Unsigned)
 OUTPATIENT PHYSICAL THERAPY TREATMENT/ Re-certification.    Patient Name: Jacqueline Steele MRN: [id number] DOB:10/11/1960, 63 y.o., female Today's Date: 03/28/2024  END OF SESSION:  PT End of Session - 03/28/24 0845     Visit Number 23    Number of Visits 25    Date for PT Re-Evaluation 03/23/24    Progress Note Due on Visit 20    PT Start Time 0847    PT Stop Time 0927    PT Time Calculation (min) 40 min    Activity Tolerance Patient limited by pain    Behavior During Therapy [facility] for tasks assessed/performed                  No past medical history on file. Past Surgical History:  Procedure Laterality Date   TEE WITHOUT CARDIOVERSION N/A 05/29/2022   Procedure: TRANSESOPHAGEAL ECHOCARDIOGRAM (TEE);  Surgeon: Perla Evalene PARAS, MD;  Location: [facility] ORS;  Service: Cardiovascular;  Laterality: N/A;   TUBAL LIGATION     Patient Active Problem List   Diagnosis Date Noted   Acute stroke due to ischemia (HCC) 05/27/2022   Obesity (BMI 30-39.9) 05/27/2022    PCP: Fleeta Dunning, [place], MD  REFERRING PROVIDER: Fleeta Dunning, Rocky, MD  REFERRING DIAG:  Diagnosis  S46.011A (ICD-10-CM) - Strain of tendon of right rotator cuff    Rationale for Evaluation and Treatment: Rehabilitation  THERAPY DIAG:  Chronic right shoulder pain  Stiffness of right shoulder, not elsewhere classified  Muscle weakness (generalized)  ONSET DATE: 05/27/2022   SUBJECTIVE:                                                                                                                                                                                           SUBJECTIVE STATEMENT:   Pt reports that she is doing okay. States that she is having car troubles; the transmission in her car needs to be replaced. Is still working, currently, but will break down soon  Pt also states that her shoulder is feeling okay this morning.  With no significant pain over the last few weeks with modifying movements.  States that she Has not been pulling weeds in the [place] anymore, because it has hurt her shoulder in the past     PERTINENT HISTORY:  From EVAL=The pt is pleasant 63 y/o female referred to PT for strain of tendon of R RTC that developed following CVA 05/2022. Pt CVA affected R side and that she did not receive therapy for her RUE following stroke. R side is dominant side. She cannot raise her R arm, can't lift anything due to pain. General movement hurts.  She now takes extra time to get dressed and often uses her LUE to completed ADLs. She is doing everything by herself still, but it takes longer. Pt reports she is now on disability. Pt reports worst pain is a 6/10. If she sleeps she thinks pain may get to a 0, but otherwise notices it at all times. She describes pain as a burning sensation, like it is being ripped.  Pt unsure of her fine motor control R hand since she is not using it much  PMH per chart: CVA, HTN, preDM, sleep apnea, bipolar disorder, R hip pain, greater trochanteric pain syndrome or RLE, primary OA of R hip  PAIN:  Are you having pain? RUE pain, worst pain is 6/10  PRECAUTIONS:  None  RED FLAGS: Does report bladder changes since stroke (increased frequency); PT advised pt to discuss with her physician, pt verbalized understanding   WEIGHT BEARING RESTRICTIONS:  No  FALLS:  Has patient fallen in last 6 months? No  LIVING ENVIRONMENT: Lives with: lives alone Lives in: House/apartment Stairs: 4 steps with handrails  Has following equipment at home: Vannie - 2 wheeled, reports not using any assistive devices   OCCUPATION:  On disability   PLOF:  Independent  PATIENT GOALS:  She'd like to be able to use her RUE, does not want to fully lose use of it.    OBJECTIVE:  Note: Objective measures were completed at Evaluation unless otherwise noted.  DIAGNOSTIC FINDINGS:  No pertinent imaging available in chart  PATIENT SURVEYS:  Quick Dash 61  COGNITIVE  STATUS: Within functional limits for tasks assessed   SENSATION: Pt can feel a numbness/burning in her R arm Pt sensation intact to light touch BUE   Coordination: WFL rapid alt UE and chin to target  EDEMA:  Pt reports no swelling  POSTURE:  rounded shoulders, slight increase thoracic kyphosis   UE MMT: LUE: 4 to 4+/5 and no pain RUE:  4-/5 * very pain limited flexion, abduction and IR; ER is only mm group not painful  Grip strength LUE 4/5 RUE 4+/5   UE AROM:  LUE: Flexion 160 deg Abduction 126 deg IR [facility]  ER needs formally assessment appears limited  RUE: Flexion 64 deg Abduction 62 deg IR [facility] ER needs formal assessment appears limited    HAND DOMINANCE:  Right                                                                                                                             TREATMENT DATE: 03/28/24   PT performed goal assessment for re-certificaiton see goals section below for details.   Manual Therapy: 10 min supine   6 min STM to R pec minor to assist in improved R shoulder pain and improved scapulohumeral rhythm.   R GHJ grade 3 AP/PA mobilizations for improved R shoulder mobility in overhead and behind back ranges of motion. 2x10 sec bouts/plane   There.Ex: 8 min  Hooklying serratus punch 2 x 10   Hooklying Shoulder ER/IR at 60 deg x 12   Hooklying Shoulder IR/ER with towel against side x 12  Hooklying Shoulder flexion AROM with cues for scapula rhythm.    PATIENT EDUCATION:  Education details: exercise technique Person educated: Patient Education method: Explanation, Demonstration, and Verbal cues Education comprehension: verbalized understanding and returned demonstration  HOME EXERCISE PROGRAM:  Access Code: [email or web address] URL: [email or web address] Date: 03/21/2024 Prepared by: Dorina Kingfisher  Exercises - Shoulder External Rotation and Scapular Retraction with Resistance  - 1 x daily - 4-5 x weekly - 3 sets - 10  reps - 3 second hold/rep hold - Standing Shoulder Row with Anchored Resistance  - 1 x daily - 4-5 x weekly - 2 sets - 10 reps - Hooklying Scapular Protraction on Foam Roll  - 1 x daily - 4-5 x weekly - 3 sets - 8 reps - Sidelying Shoulder External Rotation  - 1 x daily - 4-5 x weekly - 3 sets - 8 reps  Access Code: [email or web address] URL: [email or web address] Date: 02/09/2024 Prepared by: Darryle Patten  Exercises - Shoulder External Rotation and Scapular Retraction with Resistance  - 1 x daily - 4-5 x weekly - 3 sets - 10 reps - 3 second hold/rep hold - Standing Shoulder Row with Anchored Resistance  - 1 x daily - 4-5 x weekly - 2 sets - 10 reps  Access Code: [email or web address] URL: [email or web address] Date: 01/18/2024 Prepared by: Massie Dollar  Exercises - Putty Squeezes  - 2 x daily - 5-7 x weekly - 2 sets - 1 reps - 60 sec  hold - Finger Lumbricals with Putty  - 2 x daily - 5-7 x weekly - 2 sets - 1 reps - 60 sec hold - Seated Scapular Retraction  - 1 x daily - 7 x weekly - 2 sets - 10 reps - Seated Shoulder Shrug Circles AROM Forward  - 1 x daily - 7 x weekly - 2 sets - 10 reps - Isometric Shoulder Extension at Wall  - 1 x daily - 7 x weekly - 3 sets - 10 reps - Standing Isometric Shoulder Flexion with Doorway - Arm Bent  - 1 x daily - 7 x weekly - 3 sets - 10 reps   ASSESSMENT:  CLINICAL IMPRESSION: Continuing PT POC working on R shoulder pain and ROM deficits. Pt continues to benefit from NMR and pec minor release with continuous improvement in AROM the last 4 sessions. Pt is hoping to increase strength and functional use of shoulder without having to modifiy movement prior to d/c from PT. Due to continued progress over the last several weeks, PT will continue additional month at decreased frequency to allow transition to self management  and home or community based fitness/strengthening. Pt will continue to benefit from skilled PT to address pain, ROM, and strength  deficits to allow full return to PLOF.     OBJECTIVE IMPAIRMENTS: Abnormal gait, decreased activity tolerance, decreased mobility, decreased ROM, decreased strength, hypomobility, impaired flexibility, impaired sensation, impaired UE functional use, improper body mechanics, postural dysfunction, and pain.   ACTIVITY LIMITATIONS: carrying, lifting, bathing, dressing, reach over head, hygiene/grooming, and locomotion level  PARTICIPATION LIMITATIONS: meal prep, cleaning, shopping, community activity, occupation, and yard work  PERSONAL FACTORS: Age, Sex, Time since onset of injury/illness/exacerbation, and 3+ comorbidities: PMH per chart: CVA, HTN, preDM, sleep apnea, bipolar disorder, R hip pain, greater trochanteric pain syndrome or RLE, primary OA of R hip are also affecting patient's  functional outcome.   REHAB POTENTIAL: Good  CLINICAL DECISION MAKING: Evolving/moderate complexity  EVALUATION COMPLEXITY: Moderate   GOALS: Goals reviewed with patient? Yes   SHORT TERM GOALS: Target date: 04/18/2024    Patient will be independent in home exercise program to improve strength/mobility for better functional independence with ADLs. Baseline:initiated; 4/28: pt performing HEP, to be advanced; 6/10: pt almost indep with HEP Goal status: IN PROGRESS   LONG TERM GOALS: Target date: 05/05/2024    Patient will decrease Quick DASH score by > 8 points demonstrating reduced self-reported upper extremity disability. Baseline: 61: 4/28: 70.45%; 03/14/24:  61.4 6/24: 43.2 Goal status: MET  2.  Patient will improve R shoulder AROM to > 140 degrees of flexion, scaption, and abduction for improved ability to perform overhead activities. Baseline: flex/abd 64 deg/62 deg and pain limited; 01/31/24: flex 80 deg, abd 65 deg, scaption 50 deg; 6/10: flex 140 deg, abduct 90 deg, scaption 90 6/25: Flex: 152: Abduction: 147 Scaption: 137  Goal status: IN PROGRESS  3.  Patient will reports a worst pain  no greater than 4/10 in the past two weeks to indicate QOL improvement. Baseline: 6/10; 01/31/24: 4/10 but has modified activities to reduce increased pain; 03/14/24: 9/10 with a particular movement within past week, but since this moment no pain 6/24: no pain at start of PT. 2/10 at worst.   Goal status: MET (previously met/partially met currently)  4.  Patient will increase RUE gross strength to 4+/5 as to improve functional strength for increased ADL ability. Baseline: RUE strength grossly 4-/5; 01/31/24: pain limited grossly 4-/5; 03/14/24: RUE strength grossly 4/5 Grossly 4/5 with mild discomfort in flexion and abduction.   Goal status: IN PROGRESS     PLAN:  PT FREQUENCY: 1-2x/week  PT DURATION: 6 weeks  PLANNED INTERVENTIONS: 97164- PT Re-evaluation, 97110-Therapeutic exercises, 97530- Therapeutic activity, 97112- Neuromuscular re-education, 97535- Self Care, [id number]- Manual therapy, [phone]- Gait training, [phone]- Orthotic Fit/training, [phone]- Splinting, Patient/Family education, Balance training, Stair training, Taping, Dry Needling, Joint mobilization, Spinal mobilization, Scar mobilization, DME instructions, Cryotherapy, and Moist heat.  PLAN FOR NEXT SESSION:  Expand HEP.  Strengthening focus with proper scapular rhyth,.   Massie Dollar PT, DPT  Physical Therapist - [place]  [facility]  8:20 AM 03/29/24

## 2024-03-30 ENCOUNTER — Ambulatory Visit: Admitting: Physical Therapy

## 2024-04-04 ENCOUNTER — Ambulatory Visit: Admitting: Physical Therapy

## 2024-04-06 ENCOUNTER — Ambulatory Visit: Attending: Internal Medicine | Admitting: Physical Therapy

## 2024-04-06 DIAGNOSIS — G8929 Other chronic pain: Secondary | ICD-10-CM | POA: Diagnosis present

## 2024-04-06 DIAGNOSIS — M25511 Pain in right shoulder: Secondary | ICD-10-CM | POA: Insufficient documentation

## 2024-04-06 DIAGNOSIS — M25611 Stiffness of right shoulder, not elsewhere classified: Secondary | ICD-10-CM | POA: Insufficient documentation

## 2024-04-06 DIAGNOSIS — M6281 Muscle weakness (generalized): Secondary | ICD-10-CM | POA: Diagnosis present

## 2024-04-06 NOTE — Therapy (Signed)
 OUTPATIENT PHYSICAL THERAPY TREATMENT/    Patient Name: Jacqueline Steele MRN: [id number] DOB:04/12/1961, 63 y.o., female Today's Date: 04/06/2024  END OF SESSION:  PT End of Session - 04/06/24 0850     Visit Number 24    Number of Visits 28    Date for PT Re-Evaluation 05/05/24    Progress Note Due on Visit 30    PT Start Time 0850    PT Stop Time 0930    PT Time Calculation (min) 40 min    Activity Tolerance Patient limited by pain    Behavior During Therapy [facility] for tasks assessed/performed                  No past medical history on file. Past Surgical History:  Procedure Laterality Date   TEE WITHOUT CARDIOVERSION N/A 05/29/2022   Procedure: TRANSESOPHAGEAL ECHOCARDIOGRAM (TEE);  Surgeon: Perla Evalene PARAS, MD;  Location: [facility] ORS;  Service: Cardiovascular;  Laterality: N/A;   TUBAL LIGATION     Patient Active Problem List   Diagnosis Date Noted   Acute stroke due to ischemia (HCC) 05/27/2022   Obesity (BMI 30-39.9) 05/27/2022    PCP: Fleeta Dunning, Rocky, MD  REFERRING PROVIDER: Fleeta Dunning, Rocky, MD  REFERRING DIAG:  Diagnosis  S46.011A (ICD-10-CM) - Strain of tendon of right rotator cuff    Rationale for Evaluation and Treatment: Rehabilitation  THERAPY DIAG:  Chronic right shoulder pain  Stiffness of right shoulder, not elsewhere classified  Muscle weakness (generalized)  ONSET DATE: 05/27/2022   SUBJECTIVE:                                                                                                                                                                                           SUBJECTIVE STATEMENT:   Pt reports that she is doing well. No pain at start of PT treatment reports that she has been compliant with HEP daily; for at least a few exercises.   Still modifying activity slightly, but trying to do a little more.    PERTINENT HISTORY:  From EVAL=The pt is pleasant 63 y/o female referred to PT for strain of tendon of R RTC that  developed following CVA 05/2022. Pt CVA affected R side and that she did not receive therapy for her RUE following stroke. R side is dominant side. She cannot raise her R arm, can't lift anything due to pain. General movement hurts.  She now takes extra time to get dressed and often uses her LUE to completed ADLs. She is doing everything by herself still, but it takes longer. Pt reports she is now on disability. Pt reports worst  pain is a 6/10. If she sleeps she thinks pain may get to a 0, but otherwise notices it at all times. She describes pain as a burning sensation, like it is being ripped.  Pt unsure of her fine motor control R hand since she is not using it much  PMH per chart: CVA, HTN, preDM, sleep apnea, bipolar disorder, R hip pain, greater trochanteric pain syndrome or RLE, primary OA of R hip  PAIN:  Are you having pain? RUE pain, worst pain is 6/10  PRECAUTIONS:  None  RED FLAGS: Does report bladder changes since stroke (increased frequency); PT advised pt to discuss with her physician, pt verbalized understanding   WEIGHT BEARING RESTRICTIONS:  No  FALLS:  Has patient fallen in last 6 months? No  LIVING ENVIRONMENT: Lives with: lives alone Lives in: House/apartment Stairs: 4 steps with handrails  Has following equipment at home: Vannie - 2 wheeled, reports not using any assistive devices   OCCUPATION:  On disability   PLOF:  Independent  PATIENT GOALS:  She'd like to be able to use her RUE, does not want to fully lose use of it.    OBJECTIVE:  Note: Objective measures were completed at Evaluation unless otherwise noted.  DIAGNOSTIC FINDINGS:  No pertinent imaging available in chart  PATIENT SURVEYS:  Quick Dash 61  COGNITIVE STATUS: Within functional limits for tasks assessed   SENSATION: Pt can feel a numbness/burning in her R arm Pt sensation intact to light touch BUE   Coordination: WFL rapid alt UE and chin to target  EDEMA:  Pt reports no  swelling  POSTURE:  rounded shoulders, slight increase thoracic kyphosis   UE MMT: LUE: 4 to 4+/5 and no pain RUE:  4-/5 * very pain limited flexion, abduction and IR; ER is only mm group not painful  Grip strength LUE 4/5 RUE 4+/5   UE AROM:  LUE: Flexion 160 deg Abduction 126 deg IR [facility]  ER needs formally assessment appears limited  RUE: Flexion 64 deg Abduction 62 deg IR [facility] ER needs formal assessment appears limited    HAND DOMINANCE:  Right                                                                                                                             TREATMENT DATE: 04/06/24  Seated: Therapy ball rollout x 15 forward. Diagonal x 12 bil.   Mid row palms down with PVC pipe and GTB x 15  Low row palms up with PVC pipe and GTB x 15   Shoulder flexion with tactile cues for scapular rhythm x 10 bil.   Supine: Rhythmic stabilization un-weighted 30 sec lateral and 30sec superior/inferior.  Unweighted alphabet performed bil with shoulder at 90 deg flexion A-Z Shoulder ER with scapular setting.   Manual:  Gentle traction 3 x 25 sec  STM to UT and lateral pectoral fibers x 1 min each.  STM also performed to anterior deltoid  with point tenderness with biceps tendon on this day.   Encouraged to perform weeding at home with row motion and reduced shoulder flexion to reduce risk of pain with resisted flexion movements.   PATIENT EDUCATION:  Education details: exercise technique Person educated: Patient Education method: Explanation, Demonstration, and Verbal cues Education comprehension: verbalized understanding and returned demonstration  HOME EXERCISE PROGRAM:  Access Code: [email or web address] URL: [email or web address] Date: 03/21/2024 Prepared by: Dorina Kingfisher  Exercises - Shoulder External Rotation and Scapular Retraction with Resistance  - 1 x daily - 4-5 x weekly - 3 sets - 10 reps - 3 second hold/rep hold - Standing Shoulder Row  with Anchored Resistance  - 1 x daily - 4-5 x weekly - 2 sets - 10 reps - Hooklying Scapular Protraction on Foam Roll  - 1 x daily - 4-5 x weekly - 3 sets - 8 reps - Sidelying Shoulder External Rotation  - 1 x daily - 4-5 x weekly - 3 sets - 8 reps  Access Code: [email or web address] URL: [email or web address] Date: 02/09/2024 Prepared by: Darryle Patten  Exercises - Shoulder External Rotation and Scapular Retraction with Resistance  - 1 x daily - 4-5 x weekly - 3 sets - 10 reps - 3 second hold/rep hold - Standing Shoulder Row with Anchored Resistance  - 1 x daily - 4-5 x weekly - 2 sets - 10 reps  Access Code: [email or web address] URL: [email or web address] Date: 01/18/2024 Prepared by: Massie Dollar  Exercises - Putty Squeezes  - 2 x daily - 5-7 x weekly - 2 sets - 1 reps - 60 sec  hold - Finger Lumbricals with Putty  - 2 x daily - 5-7 x weekly - 2 sets - 1 reps - 60 sec hold - Seated Scapular Retraction  - 1 x daily - 7 x weekly - 2 sets - 10 reps - Seated Shoulder Shrug Circles AROM Forward  - 1 x daily - 7 x weekly - 2 sets - 10 reps - Isometric Shoulder Extension at Wall  - 1 x daily - 7 x weekly - 3 sets - 10 reps - Standing Isometric Shoulder Flexion with Doorway - Arm Bent  - 1 x daily - 7 x weekly - 3 sets - 10 reps   ASSESSMENT:  CLINICAL IMPRESSION: Continuing PT POC working on R shoulder pain and ROM deficits. Pt reports that she has had very little pain since last session. Increased resistance applied with TE on this day due to ~1 week pain free, but pain increased with bicep dominate row motion and resisted flexion. Continued to instruct pt in scapular positioning and rhythm to reduce shoulder pain; pt verbalized understanding, but difficulty with motor planning movement without verbal cues from PT.  Pt will continue to benefit from skilled PT to address pain, ROM, and strength deficits to allow full return to PLOF.     OBJECTIVE IMPAIRMENTS: Abnormal gait, decreased  activity tolerance, decreased mobility, decreased ROM, decreased strength, hypomobility, impaired flexibility, impaired sensation, impaired UE functional use, improper body mechanics, postural dysfunction, and pain.   ACTIVITY LIMITATIONS: carrying, lifting, bathing, dressing, reach over head, hygiene/grooming, and locomotion level  PARTICIPATION LIMITATIONS: meal prep, cleaning, shopping, community activity, occupation, and yard work  PERSONAL FACTORS: Age, Sex, Time since onset of injury/illness/exacerbation, and 3+ comorbidities: PMH per chart: CVA, HTN, preDM, sleep apnea, bipolar disorder, R hip pain, greater trochanteric pain syndrome or RLE, primary OA of R hip are also affecting patient's functional outcome.   REHAB POTENTIAL: Good  CLINICAL DECISION  MAKING: Evolving/moderate complexity  EVALUATION COMPLEXITY: Moderate   GOALS: Goals reviewed with patient? Yes   SHORT TERM GOALS: Target date: 04/18/2024    Patient will be independent in home exercise program to improve strength/mobility for better functional independence with ADLs. Baseline:initiated; 4/28: pt performing HEP, to be advanced; 6/10: pt almost indep with HEP Goal status: IN PROGRESS   LONG TERM GOALS: Target date: 05/05/2024    Patient will decrease Quick DASH score by > 8 points demonstrating reduced self-reported upper extremity disability. Baseline: 61: 4/28: 70.45%; 03/14/24:  61.4 6/24: 43.2 Goal status: MET  2.  Patient will improve R shoulder AROM to > 140 degrees of flexion, scaption, and abduction for improved ability to perform overhead activities. Baseline: flex/abd 64 deg/62 deg and pain limited; 01/31/24: flex 80 deg, abd 65 deg, scaption 50 deg; 6/10: flex 140 deg, abduct 90 deg, scaption 90 6/25: Flex: 152: Abduction: 147 Scaption: 137  Goal status: IN PROGRESS  3.  Patient will reports a worst pain no greater than 4/10 in the past two weeks to indicate QOL improvement. Baseline: 6/10;  01/31/24: 4/10 but has modified activities to reduce increased pain; 03/14/24: 9/10 with a particular movement within past week, but since this moment no pain 6/24: no pain at start of PT. 2/10 at worst.   Goal status: MET (previously met/partially met currently)  4.  Patient will increase RUE gross strength to 4+/5 as to improve functional strength for increased ADL ability. Baseline: RUE strength grossly 4-/5; 01/31/24: pain limited grossly 4-/5; 03/14/24: RUE strength grossly 4/5 Grossly 4/5 with mild discomfort in flexion and abduction.   Goal status: IN PROGRESS     PLAN:  PT FREQUENCY: 1-2x/week  PT DURATION: 6 weeks  PLANNED INTERVENTIONS: 97164- PT Re-evaluation, 97110-Therapeutic exercises, 97530- Therapeutic activity, 97112- Neuromuscular re-education, 97535- Self Care, 02859- Manual therapy, [phone]- Gait training, [phone]- Orthotic Fit/training, [phone]- Splinting, Patient/Family education, Balance training, Stair training, Taping, Dry Needling, Joint mobilization, Spinal mobilization, Scar mobilization, DME instructions, Cryotherapy, and Moist heat.  PLAN FOR NEXT SESSION:  Expand HEP as pain allows  Strengthening focus with proper scapular rhyth,.   Massie Dollar PT, DPT  Physical Therapist - [place]  [facility]  8:52 AM 04/06/24

## 2024-04-11 ENCOUNTER — Encounter: Admitting: Physical Therapy

## 2024-04-13 ENCOUNTER — Ambulatory Visit: Admitting: Physical Therapy

## 2024-04-13 DIAGNOSIS — M6281 Muscle weakness (generalized): Secondary | ICD-10-CM

## 2024-04-13 DIAGNOSIS — M25611 Stiffness of right shoulder, not elsewhere classified: Secondary | ICD-10-CM

## 2024-04-13 DIAGNOSIS — G8929 Other chronic pain: Secondary | ICD-10-CM

## 2024-04-13 DIAGNOSIS — M25511 Pain in right shoulder: Secondary | ICD-10-CM | POA: Diagnosis not present

## 2024-04-13 NOTE — Therapy (Signed)
 OUTPATIENT PHYSICAL THERAPY TREATMENT/    Patient Name: Jacqueline Steele MRN: [id number] DOB:[birth year], 63 y.o., female Today's Date: 04/13/2024  END OF SESSION:  PT End of Session - 04/13/24 0851     Visit Number 25    Number of Visits 28    Date for PT Re-Evaluation 05/05/24    Progress Note Due on Visit 30    PT Start Time 0850    PT Stop Time 0930    PT Time Calculation (min) 40 min    Activity Tolerance Patient limited by pain    Behavior During Therapy [facility] for tasks assessed/performed                  No past medical history on file. Past Surgical History:  Procedure Laterality Date   TEE WITHOUT CARDIOVERSION N/A 05/29/2022   Procedure: TRANSESOPHAGEAL ECHOCARDIOGRAM (TEE);  Surgeon: Perla Evalene PARAS, MD;  Location: [facility] ORS;  Service: Cardiovascular;  Laterality: N/A;   TUBAL LIGATION     Patient Active Problem List   Diagnosis Date Noted   Acute stroke due to ischemia (HCC) 05/27/2022   Obesity (BMI 30-39.9) 05/27/2022    PCP: Fleeta Dunning, Rocky, MD  REFERRING PROVIDER: Fleeta Dunning, Rocky, MD  REFERRING DIAG:  Diagnosis  S46.011A (ICD-10-CM) - Strain of tendon of right rotator cuff    Rationale for Evaluation and Treatment: Rehabilitation  THERAPY DIAG:  Chronic right shoulder pain  Stiffness of right shoulder, not elsewhere classified  Muscle weakness (generalized)  ONSET DATE: 05/27/2022   SUBJECTIVE:                                                                                                                                                                                           SUBJECTIVE STATEMENT:   Pt reports that she is doing well. States that her RLE in the hip has been bothering her more. The last few days. No pain in the shoulder over the last week, and resting it when it feels sore.    PERTINENT HISTORY:  From EVAL=The pt is pleasant 63 y/o female referred to PT for strain of tendon of R RTC that developed following CVA  05/2022. Pt CVA affected R side and that she did not receive therapy for her RUE following stroke. R side is dominant side. She cannot raise her R arm, can't lift anything due to pain. General movement hurts.  She now takes extra time to get dressed and often uses her LUE to completed ADLs. She is doing everything by herself still, but it takes longer. Pt reports she is now on disability. Pt reports worst pain is a  6/10. If she sleeps she thinks pain may get to a 0, but otherwise notices it at all times. She describes pain as a burning sensation, like it is being ripped.  Pt unsure of her fine motor control R hand since she is not using it much  PMH per chart: CVA, HTN, preDM, sleep apnea, bipolar disorder, R hip pain, greater trochanteric pain syndrome or RLE, primary OA of R hip  PAIN:  Are you having pain? RUE pain, worst pain is 6/10  PRECAUTIONS:  None  RED FLAGS: Does report bladder changes since stroke (increased frequency); PT advised pt to discuss with her physician, pt verbalized understanding   WEIGHT BEARING RESTRICTIONS:  No  FALLS:  Has patient fallen in last 6 months? No  LIVING ENVIRONMENT: Lives with: lives alone Lives in: House/apartment Stairs: 4 steps with handrails  Has following equipment at home: Vannie - 2 wheeled, reports not using any assistive devices   OCCUPATION:  On disability   PLOF:  Independent  PATIENT GOALS:  She'd like to be able to use her RUE, does not want to fully lose use of it.    OBJECTIVE:  Note: Objective measures were completed at Evaluation unless otherwise noted.  DIAGNOSTIC FINDINGS:  No pertinent imaging available in chart  PATIENT SURVEYS:  Quick Dash 61  COGNITIVE STATUS: Within functional limits for tasks assessed   SENSATION: Pt can feel a numbness/burning in her R arm Pt sensation intact to light touch BUE   Coordination: WFL rapid alt UE and chin to target  EDEMA:  Pt reports no swelling  POSTURE:   rounded shoulders, slight increase thoracic kyphosis   UE MMT: LUE: 4 to 4+/5 and no pain RUE:  4-/5 * very pain limited flexion, abduction and IR; ER is only mm group not painful  Grip strength LUE 4/5 RUE 4+/5   UE AROM:  LUE: Flexion 160 deg Abduction 126 deg IR [facility]  ER needs formally assessment appears limited  RUE: Flexion 64 deg Abduction 62 deg IR [facility] ER needs formal assessment appears limited    HAND DOMINANCE:  Right                                                                                                                             TREATMENT DATE: 04/13/24   Nustep BLE/BUE reciprocal movement training with cues for keep UE in pain free range and scapular setting to reduce UT activation   Seated: Therapy ball rollout 2x 15 forward.  Shoulder ER with scapular retraction 2 x 12 RTB  Shoulder abduction 1.5# [employer] 2 x 8  Shoulder flexion with 1.5# [employer] 2 x 8 Tactile cues for improved scapular rhythm.  Scapular retraction with tactile cues for lower trap activation   STM in seated for TP management in the R UT x 3 min  Supine STM to perform TP release to teres major x 2 min  Grade 2 AP mobilization through shoulder obduction  from 0 deg to 80 deg. 30 sec x 3   No pain reported in shoulder throughout treatment, reports movements are just hard work   PATIENT EDUCATION:  Education details: exercise technique Person educated: Patient Education method: Programmer, multimedia, Facilities manager, and Verbal cues Education comprehension: verbalized understanding and returned demonstration  HOME EXERCISE PROGRAM:  Access Code: [email or web address] URL: [email or web address] Date: 03/21/2024 Prepared by: Dorina Kingfisher  Exercises - Shoulder External Rotation and Scapular Retraction with Resistance  - 1 x daily - 4-5 x weekly - 3 sets - 10 reps - 3 second hold/rep hold - Standing Shoulder Row with Anchored Resistance  - 1 x daily - 4-5 x weekly - 2 sets - 10 reps -  Hooklying Scapular Protraction on Foam Roll  - 1 x daily - 4-5 x weekly - 3 sets - 8 reps - Sidelying Shoulder External Rotation  - 1 x daily - 4-5 x weekly - 3 sets - 8 reps  Access Code: [email or web address] URL: [email or web address] Date: 02/09/2024 Prepared by: Darryle Patten  Exercises - Shoulder External Rotation and Scapular Retraction with Resistance  - 1 x daily - 4-5 x weekly - 3 sets - 10 reps - 3 second hold/rep hold - Standing Shoulder Row with Anchored Resistance  - 1 x daily - 4-5 x weekly - 2 sets - 10 reps  Access Code: [email or web address] URL: [email or web address] Date: 01/18/2024 Prepared by: Massie Dollar  Exercises - Putty Squeezes  - 2 x daily - 5-7 x weekly - 2 sets - 1 reps - 60 sec  hold - Finger Lumbricals with Putty  - 2 x daily - 5-7 x weekly - 2 sets - 1 reps - 60 sec hold - Seated Scapular Retraction  - 1 x daily - 7 x weekly - 2 sets - 10 reps - Seated Shoulder Shrug Circles AROM Forward  - 1 x daily - 7 x weekly - 2 sets - 10 reps - Isometric Shoulder Extension at Wall  - 1 x daily - 7 x weekly - 3 sets - 10 reps - Standing Isometric Shoulder Flexion with Doorway - Arm Bent  - 1 x daily - 7 x weekly - 3 sets - 10 reps   ASSESSMENT:  CLINICAL IMPRESSION: Continuing PT POC working on R shoulder pain and ROM deficits. Pt reports that she has had very little pain since last session. Continued to increase resistance in flexion and abduction motions,. No pain reported in this day, but cues required for decreased shoulder elevation. Plan to d/c from PT for shoulder pain management at next session as pt has been pain free for >2 weeks per report between PT treatments. At this time pt  will continue to benefit from skilled PT to address pain, ROM, and strength deficits to allow full return to PLOF.     OBJECTIVE IMPAIRMENTS: Abnormal gait, decreased activity tolerance, decreased mobility, decreased ROM, decreased strength, hypomobility, impaired  flexibility, impaired sensation, impaired UE functional use, improper body mechanics, postural dysfunction, and pain.   ACTIVITY LIMITATIONS: carrying, lifting, bathing, dressing, reach over head, hygiene/grooming, and locomotion level  PARTICIPATION LIMITATIONS: meal prep, cleaning, shopping, community activity, occupation, and yard work  PERSONAL FACTORS: Age, Sex, Time since onset of injury/illness/exacerbation, and 3+ comorbidities: PMH per chart: CVA, HTN, preDM, sleep apnea, bipolar disorder, R hip pain, greater trochanteric pain syndrome or RLE, primary OA of R hip are also affecting patient's functional outcome.   REHAB POTENTIAL: Good  CLINICAL DECISION MAKING: Evolving/moderate complexity  EVALUATION COMPLEXITY: Moderate  GOALS: Goals reviewed with patient? Yes   SHORT TERM GOALS: Target date: 04/18/2024    Patient will be independent in home exercise program to improve strength/mobility for better functional independence with ADLs. Baseline:initiated; 4/28: pt performing HEP, to be advanced; 6/10: pt almost indep with HEP Goal status: IN PROGRESS   LONG TERM GOALS: Target date: 05/05/2024    Patient will decrease Quick DASH score by > 8 points demonstrating reduced self-reported upper extremity disability. Baseline: 61: 4/28: 70.45%; 03/14/24:  61.4 6/24: 43.2 Goal status: MET  2.  Patient will improve R shoulder AROM to > 140 degrees of flexion, scaption, and abduction for improved ability to perform overhead activities. Baseline: flex/abd 64 deg/62 deg and pain limited; 01/31/24: flex 80 deg, abd 65 deg, scaption 50 deg; 6/10: flex 140 deg, abduct 90 deg, scaption 90 6/25: Flex: 152: Abduction: 147 Scaption: 137  Goal status: IN PROGRESS  3.  Patient will reports a worst pain no greater than 4/10 in the past two weeks to indicate QOL improvement. Baseline: 6/10; 01/31/24: 4/10 but has modified activities to reduce increased pain; 03/14/24: 9/10 with a particular  movement within past week, but since this moment no pain 6/24: no pain at start of PT. 2/10 at worst.   Goal status: MET (previously met/partially met currently)  4.  Patient will increase RUE gross strength to 4+/5 as to improve functional strength for increased ADL ability. Baseline: RUE strength grossly 4-/5; 01/31/24: pain limited grossly 4-/5; 03/14/24: RUE strength grossly 4/5 Grossly 4/5 with mild discomfort in flexion and abduction.   Goal status: IN PROGRESS     PLAN:  PT FREQUENCY: 1-2x/week  PT DURATION: 6 weeks  PLANNED INTERVENTIONS: 97164- PT Re-evaluation, 97110-Therapeutic exercises, 97530- Therapeutic activity, 97112- Neuromuscular re-education, 97535- Self Care, 02859- Manual therapy, [phone]- Gait training, [phone]- Orthotic Fit/training, [phone]- Splinting, Patient/Family education, Balance training, Stair training, Taping, Dry Needling, Joint mobilization, Spinal mobilization, Scar mobilization, DME instructions, Cryotherapy, and Moist heat.  PLAN FOR NEXT SESSION:  Expand HEP as pain allows  Strengthening focus with proper scapular rhyth,.  D/c assessment.   Massie Dollar PT, DPT  Physical Therapist - [place]  [facility]  8:52 AM 04/13/24

## 2024-04-18 ENCOUNTER — Encounter: Admitting: Physical Therapy

## 2024-04-20 ENCOUNTER — Ambulatory Visit: Admitting: Physical Therapy

## 2024-04-20 ENCOUNTER — Telehealth: Payer: Self-pay | Admitting: Physical Therapy

## 2024-04-20 NOTE — Telephone Encounter (Signed)
 Attempted to contact pt via telephone to leave voice mail informing of missed appointment and informed pt of future PT appointment date and time. Voice mail box not set up, so PT unable to leave message.   Massie Dollar PT, DPT  Physical Therapist - [place]  [facility]  9:22 AM 04/20/24

## 2024-04-25 ENCOUNTER — Encounter: Admitting: Physical Therapy

## 2024-04-27 ENCOUNTER — Ambulatory Visit: Admitting: Physical Therapy

## 2024-04-27 ENCOUNTER — Telehealth: Payer: Self-pay | Admitting: Physical Therapy

## 2024-04-27 NOTE — Telephone Encounter (Signed)
 Attempted to contact pt via telephone to leave voice mail informing of missed appointment and informed pt of future PT appointment date and time. Voice mail box not set up, so PT unable to leave message.  Of note, pt was planned to d/c from PT on this day, so future appointments have been removed from schedule. Will need to obtain additional Order from PT to return to PT services.    Massie Dollar PT, DPT  Physical Therapist - [place]  [facility]  9:22 AM 04/20/24

## 2024-05-02 ENCOUNTER — Encounter: Admitting: Physical Therapy

## 2024-05-04 ENCOUNTER — Encounter: Admitting: Physical Therapy

## 2024-05-09 ENCOUNTER — Encounter: Admitting: Physical Therapy

## 2024-05-11 ENCOUNTER — Encounter: Admitting: Physical Therapy

## 2024-05-16 ENCOUNTER — Encounter: Admitting: Physical Therapy

## 2024-05-18 ENCOUNTER — Encounter: Admitting: Physical Therapy
